# Patient Record
Sex: Female | Born: 1955 | Race: White | Hispanic: No | Marital: Single | Smoking: Never smoker
Health system: Southern US, Community
[De-identification: ages and names within clinical notes are randomized; demographics above are authoritative.]

## PROBLEM LIST (undated history)

## (undated) DIAGNOSIS — N200 Calculus of kidney: Secondary | ICD-10-CM

## (undated) DIAGNOSIS — K5190 Ulcerative colitis, unspecified, without complications: Secondary | ICD-10-CM

## (undated) DIAGNOSIS — H269 Unspecified cataract: Secondary | ICD-10-CM

## (undated) DIAGNOSIS — E559 Vitamin D deficiency, unspecified: Secondary | ICD-10-CM

## (undated) DIAGNOSIS — T7840XA Allergy, unspecified, initial encounter: Secondary | ICD-10-CM

## (undated) DIAGNOSIS — M1990 Unspecified osteoarthritis, unspecified site: Secondary | ICD-10-CM

## (undated) HISTORY — DX: Calculus of kidney: N20.0

## (undated) HISTORY — DX: Unspecified cataract: H26.9

## (undated) HISTORY — DX: Unspecified osteoarthritis, unspecified site: M19.90

## (undated) HISTORY — DX: Ulcerative colitis, unspecified, without complications: K51.90

## (undated) HISTORY — DX: Allergy, unspecified, initial encounter: T78.40XA

## (undated) HISTORY — DX: Vitamin D deficiency, unspecified: E55.9

---

## 2000-03-10 ENCOUNTER — Other Ambulatory Visit: Admission: RE | Admit: 2000-03-10 | Discharge: 2000-03-10 | Payer: Self-pay | Admitting: Obstetrics and Gynecology

## 2000-03-11 ENCOUNTER — Other Ambulatory Visit: Admission: RE | Admit: 2000-03-11 | Discharge: 2000-03-11 | Payer: Self-pay | Admitting: Obstetrics and Gynecology

## 2000-04-19 ENCOUNTER — Encounter: Payer: Self-pay | Admitting: Family Medicine

## 2000-04-19 ENCOUNTER — Encounter: Admission: RE | Admit: 2000-04-19 | Discharge: 2000-04-19 | Payer: Self-pay | Admitting: Family Medicine

## 2000-06-28 ENCOUNTER — Encounter: Admission: RE | Admit: 2000-06-28 | Discharge: 2000-06-28 | Payer: Self-pay | Admitting: Family Medicine

## 2000-06-28 ENCOUNTER — Encounter: Payer: Self-pay | Admitting: Family Medicine

## 2001-03-01 ENCOUNTER — Other Ambulatory Visit: Admission: RE | Admit: 2001-03-01 | Discharge: 2001-03-01 | Payer: Self-pay | Admitting: Obstetrics and Gynecology

## 2001-06-13 ENCOUNTER — Other Ambulatory Visit: Admission: RE | Admit: 2001-06-13 | Discharge: 2001-06-13 | Payer: Self-pay | Admitting: Obstetrics and Gynecology

## 2001-08-02 ENCOUNTER — Encounter: Payer: Self-pay | Admitting: Family Medicine

## 2001-08-02 ENCOUNTER — Encounter: Admission: RE | Admit: 2001-08-02 | Discharge: 2001-08-02 | Payer: Self-pay | Admitting: Family Medicine

## 2002-07-02 ENCOUNTER — Encounter: Admission: RE | Admit: 2002-07-02 | Discharge: 2002-07-02 | Payer: Self-pay | Admitting: Family Medicine

## 2002-07-02 ENCOUNTER — Encounter: Payer: Self-pay | Admitting: Family Medicine

## 2002-07-25 ENCOUNTER — Other Ambulatory Visit: Admission: RE | Admit: 2002-07-25 | Discharge: 2002-07-25 | Payer: Self-pay | Admitting: Obstetrics and Gynecology

## 2004-12-17 ENCOUNTER — Encounter: Admission: RE | Admit: 2004-12-17 | Discharge: 2004-12-17 | Payer: Self-pay | Admitting: Obstetrics and Gynecology

## 2006-08-08 ENCOUNTER — Other Ambulatory Visit: Admission: RE | Admit: 2006-08-08 | Discharge: 2006-08-08 | Payer: Self-pay | Admitting: Obstetrics & Gynecology

## 2007-02-20 ENCOUNTER — Ambulatory Visit: Payer: Self-pay | Admitting: Family Medicine

## 2007-08-22 DIAGNOSIS — R519 Headache, unspecified: Secondary | ICD-10-CM | POA: Insufficient documentation

## 2007-08-22 DIAGNOSIS — R51 Headache: Secondary | ICD-10-CM | POA: Insufficient documentation

## 2011-07-19 ENCOUNTER — Other Ambulatory Visit: Payer: Self-pay | Admitting: Obstetrics & Gynecology

## 2011-07-19 DIAGNOSIS — Z1231 Encounter for screening mammogram for malignant neoplasm of breast: Secondary | ICD-10-CM

## 2011-07-26 ENCOUNTER — Ambulatory Visit: Payer: Self-pay

## 2012-01-17 ENCOUNTER — Ambulatory Visit (INDEPENDENT_AMBULATORY_CARE_PROVIDER_SITE_OTHER): Payer: 59 | Admitting: Family Medicine

## 2012-01-17 DIAGNOSIS — J0110 Acute frontal sinusitis, unspecified: Secondary | ICD-10-CM

## 2012-01-17 DIAGNOSIS — J00 Acute nasopharyngitis [common cold]: Secondary | ICD-10-CM

## 2012-01-17 DIAGNOSIS — J310 Chronic rhinitis: Secondary | ICD-10-CM

## 2012-01-17 DIAGNOSIS — J019 Acute sinusitis, unspecified: Secondary | ICD-10-CM

## 2012-01-17 MED ORDER — AZITHROMYCIN 250 MG PO TABS: ORAL_TABLET | ORAL | Status: AC

## 2012-01-17 MED ORDER — FLUTICASONE PROPIONATE 50 MCG/ACT NA SUSP: 2.0000 | Freq: Every day | NASAL | Status: DC

## 2012-01-17 NOTE — Progress Notes (Signed)
  [facility]:  Office Visit  Chief Complaint:  Chief Complaint  Patient presents with  . Sinusitis    * 4 days    HPI: Mia Jordan is a 56 y.o. female who complains of  Sinus pressure, congestion, clear cough, rhinorrhea, mild fever, chills, msk aches starting today but other sxs started 3 days ago. No ear pain. Pt had flu vaccine. Tried OTC meds ie Mucinex DM without relief.   History reviewed. No pertinent past medical history. History reviewed. No pertinent past surgical history. History   Social History  . Marital Status: Divorced    Spouse Name: N/A    Number of Children: N/A  . Years of Education: N/A   Social History Main Topics  . Smoking status: Never Smoker   . Smokeless tobacco: Never Used  . Alcohol Use: Yes  . Drug Use: No  . Sexually Active: Not Currently   Other Topics Concern  . None   Social History Narrative  . None   No family history on file. No Known Allergies Prior to Admission medications   Not on File     ROS: The patient denies night sweats, unintentional weight loss, chest pain, palpitations, wheezing, dyspnea on exertion, nausea, vomiting, abdominal pain, dysuria, hematuria, melena, numbness, weakness, or tingling.   All other systems have been reviewed and were otherwise negative with the exception of those mentioned in the HPI and as above.    PHYSICAL EXAM: Filed Vitals:   01/17/12 0908  BP: 116/72  Pulse: 80  Temp: 98.1 F (36.7 C)  Resp: 18   Filed Vitals:   01/17/12 0908  Height: 5' 7"  (1.702 m)  Weight: 151 lb (68.493 kg)   Body mass index is 23.65 kg/(m^2).  General: Alert, no acute distress HEENT:  Normocephalic, atraumatic, oropharynx patent.No exudates, + eythema. + sinus pressure and tenderness, max bialterally.  Cardiovascular:  Regular rate and rhythm, no rubs murmurs or gallops.  No Carotid bruits, radial pulse intact. No pedal edema.  Respiratory: Clear to auscultation bilaterally.   No wheezes, rales, or rhonchi.  No cyanosis, no use of accessory musculature GI: No organomegaly, abdomen is soft and non-tender, positive bowel sounds.  No masses. Skin: No rashes. Neurologic: Facial musculature symmetric. Psychiatric: Patient is appropriate throughout our interaction. Lymphatic: No axillary or cervical lymphadenopathy Musculoskeletal: Gait intact. Genitourinary:     ASSESSMENT/PLAN: Encounter Diagnoses  Name Primary?  . Sinusitis acute   . Rhinitis      1. Rx Z-pack. Patient declines flu swab. Sx treatment.  2. Rx Flonase.   Ayauna Mcnay, [place], DO 01/17/2012 9:42 AM

## 2012-01-17 NOTE — Patient Instructions (Signed)
Sinusitis Sinuses are air pockets within the bones of your face. The growth of bacteria within a sinus leads to infection. The infection prevents the sinuses from draining. This infection is called sinusitis. SYMPTOMS   There will be different areas of pain depending on which sinuses have become infected.  The maxillary sinuses often produce pain beneath the eyes.     Frontal sinusitis may cause pain in the middle of the forehead and above the eyes.  Other problems (symptoms) include:  Toothaches.     Colored, pus-like (purulent) drainage from the nose.     Swelling, warmth, and tenderness over the sinus areas may be signs of infection.  TREATMENT   Sinusitis is most often determined by an exam.X-rays may be taken. If x-rays have been taken, make sure you obtain your results or find out how you are to obtain them. Your caregiver may give you medications (antibiotics). These are medications that will help kill the bacteria causing the infection. You may also be given a medication (decongestant) that helps to reduce sinus swelling.   HOME CARE INSTRUCTIONS    Only take over-the-counter or prescription medicines for pain, discomfort, or fever as directed by your caregiver.     Drink extra fluids. Fluids help thin the mucus so your sinuses can drain more easily.     Applying either moist heat or ice packs to the sinus areas may help relieve discomfort.     Use saline nasal sprays to help moisten your sinuses. The sprays can be found at your local drugstore.  SEEK IMMEDIATE MEDICAL CARE IF:  You have a fever.     You have increasing pain, severe headaches, or toothache.     You have nausea, vomiting, or drowsiness.     You develop unusual swelling around the face or trouble seeing.  MAKE SURE YOU:    Understand these instructions.     Will watch your condition.     Will get help right away if you are not doing well or get worse.  Document Released: 11/29/2005 Document Revised:  08/11/2011 Document Reviewed: 06/28/2007 [facility] Patient Information 2012 [place].Sinusitis Sinuses are air pockets within the bones of your face. The growth of bacteria within a sinus leads to infection. The infection prevents the sinuses from draining. This infection is called sinusitis. SYMPTOMS   There will be different areas of pain depending on which sinuses have become infected.  The maxillary sinuses often produce pain beneath the eyes.     Frontal sinusitis may cause pain in the middle of the forehead and above the eyes.  Other problems (symptoms) include:  Toothaches.     Colored, pus-like (purulent) drainage from the nose.     Swelling, warmth, and tenderness over the sinus areas may be signs of infection.  TREATMENT   Sinusitis is most often determined by an exam.X-rays may be taken. If x-rays have been taken, make sure you obtain your results or find out how you are to obtain them. Your caregiver may give you medications (antibiotics). These are medications that will help kill the bacteria causing the infection. You may also be given a medication (decongestant) that helps to reduce sinus swelling.   HOME CARE INSTRUCTIONS    Only take over-the-counter or prescription medicines for pain, discomfort, or fever as directed by your caregiver.     Drink extra fluids. Fluids help thin the mucus so your sinuses can drain more easily.     Applying either moist heat or ice  packs to the sinus areas may help relieve discomfort.     Use saline nasal sprays to help moisten your sinuses. The sprays can be found at your local drugstore.  SEEK IMMEDIATE MEDICAL CARE IF:  You have a fever.     You have increasing pain, severe headaches, or toothache.     You have nausea, vomiting, or drowsiness.     You develop unusual swelling around the face or trouble seeing.  MAKE SURE YOU:    Understand these instructions.     Will watch your condition.     Will get help right away  if you are not doing well or get worse.  Document Released: 11/29/2005 Document Revised: 08/11/2011 Document Reviewed: 06/28/2007 [facility] Patient Information 2012 [place].

## 2012-01-26 LAB — HM COLONOSCOPY

## 2012-07-13 ENCOUNTER — Ambulatory Visit
Admission: RE | Admit: 2012-07-13 | Discharge: 2012-07-13 | Disposition: A | Discharge status: Home / Self Care | Payer: Self-pay | Source: Ambulatory Visit | Attending: Obstetrics & Gynecology | Admitting: Obstetrics & Gynecology

## 2012-07-13 DIAGNOSIS — Z1231 Encounter for screening mammogram for malignant neoplasm of breast: Secondary | ICD-10-CM

## 2012-07-18 ENCOUNTER — Other Ambulatory Visit: Payer: Self-pay | Admitting: Obstetrics & Gynecology

## 2012-07-18 DIAGNOSIS — R928 Other abnormal and inconclusive findings on diagnostic imaging of breast: Secondary | ICD-10-CM

## 2012-07-27 ENCOUNTER — Ambulatory Visit
Admission: RE | Admit: 2012-07-27 | Discharge: 2012-07-27 | Disposition: A | Discharge status: Home / Self Care | Payer: 59 | Source: Ambulatory Visit | Attending: Obstetrics & Gynecology | Admitting: Obstetrics & Gynecology

## 2012-07-27 ENCOUNTER — Other Ambulatory Visit: Payer: Self-pay | Admitting: Obstetrics & Gynecology

## 2012-07-27 DIAGNOSIS — R928 Other abnormal and inconclusive findings on diagnostic imaging of breast: Secondary | ICD-10-CM

## 2013-04-07 ENCOUNTER — Ambulatory Visit: Payer: 59

## 2013-04-07 ENCOUNTER — Ambulatory Visit (INDEPENDENT_AMBULATORY_CARE_PROVIDER_SITE_OTHER): Payer: 59 | Admitting: Family Medicine

## 2013-04-07 VITALS — BP 128/84 | HR 60 | Temp 97.3°F | Resp 18 | Ht 67.0 in | Wt 159.0 lb

## 2013-04-07 DIAGNOSIS — R109 Unspecified abdominal pain: Secondary | ICD-10-CM

## 2013-04-07 DIAGNOSIS — N200 Calculus of kidney: Secondary | ICD-10-CM

## 2013-04-07 LAB — COMPREHENSIVE METABOLIC PANEL
ALT: 27 U/L (ref 0–35)
AST: 26 U/L (ref 0–37)
Albumin: 4.4 g/dL (ref 3.5–5.2)
Alkaline Phosphatase: 50 U/L (ref 39–117)
BUN: 21 mg/dL (ref 6–23)
CO2: 27 mEq/L (ref 19–32)
Calcium: 9.3 mg/dL (ref 8.4–10.5)
Chloride: 103 mEq/L (ref 96–112)
Creat: 0.83 mg/dL (ref 0.50–1.10)
Glucose, Bld: 97 mg/dL (ref 70–99)
Potassium: 4 mEq/L (ref 3.5–5.3)
Sodium: 137 mEq/L (ref 135–145)
Total Bilirubin: 1.1 mg/dL (ref 0.3–1.2)
Total Protein: 6.8 g/dL (ref 6.0–8.3)

## 2013-04-07 LAB — POCT UA - MICROSCOPIC ONLY
Casts, Ur, LPF, POC: NEGATIVE
Crystals, Ur, HPF, POC: NEGATIVE
Yeast, UA: NEGATIVE

## 2013-04-07 LAB — POCT CBC
Granulocyte percent: 77.4 %G (ref 37–80)
HCT, POC: 38.6 % (ref 37.7–47.9)
Hemoglobin: 12.2 g/dL (ref 12.2–16.2)
Lymph, poc: 1.1 (ref 0.6–3.4)
MCH, POC: 29.4 pg (ref 27–31.2)
MCHC: 31.6 g/dL — AB (ref 31.8–35.4)
MCV: 92.9 fL (ref 80–97)
MID (cbc): 0.5 (ref 0–0.9)
MPV: 10.8 fL (ref 0–99.8)
POC Granulocyte: 5.5 (ref 2–6.9)
POC LYMPH PERCENT: 16 %L (ref 10–50)
POC MID %: 6.6 %M (ref 0–12)
Platelet Count, POC: 229 10*3/uL (ref 142–424)
RBC: 4.15 M/uL (ref 4.04–5.48)
RDW, POC: 12.6 %
WBC: 7.1 10*3/uL (ref 4.6–10.2)

## 2013-04-07 LAB — POCT URINALYSIS DIPSTICK
Bilirubin, UA: NEGATIVE
Glucose, UA: NEGATIVE
Ketones, UA: 15
Nitrite, UA: NEGATIVE
Protein, UA: 30
Spec Grav, UA: 1.025
Urobilinogen, UA: 0.2
pH, UA: 6.5

## 2013-04-07 MED ORDER — CEFTRIAXONE SODIUM 1 G IJ SOLR
1.0000 g | Freq: Once | INTRAMUSCULAR | Status: AC
  Administered 2013-04-07: 1 g via INTRAMUSCULAR

## 2013-04-07 MED ORDER — ONDANSETRON 4 MG PO TBDP
8.0000 mg | ORAL_TABLET | Freq: Once | ORAL | Status: AC
  Administered 2013-04-07: 8 mg via ORAL

## 2013-04-07 MED ORDER — ONDANSETRON 4 MG PO TBDP: 4.0000 mg | ORAL_TABLET | Freq: Once | ORAL | Status: DC

## 2013-04-07 MED ORDER — TAMSULOSIN HCL 0.4 MG PO CAPS: 0.4000 mg | ORAL_CAPSULE | Freq: Every day | ORAL | Status: DC

## 2013-04-07 MED ORDER — KETOROLAC TROMETHAMINE 60 MG/2ML IM SOLN
60.0000 mg | Freq: Once | INTRAMUSCULAR | Status: AC
  Administered 2013-04-07: 60 mg via INTRAMUSCULAR

## 2013-04-07 MED ORDER — KETOROLAC TROMETHAMINE 10 MG PO TABS: 10.0000 mg | ORAL_TABLET | Freq: Four times a day (QID) | ORAL | Status: DC | PRN

## 2013-04-07 MED ORDER — ONDANSETRON 4 MG PO TBDP: 4.0000 mg | ORAL_TABLET | Freq: Three times a day (TID) | ORAL | Status: DC | PRN

## 2013-04-07 NOTE — Patient Instructions (Addendum)
Kidney Stones Kidney stones (ureteral lithiasis) are deposits that form inside your kidneys. The intense pain is caused by the stone moving through the urinary tract. When the stone moves, the ureter goes into spasm around the stone. The stone is usually passed in the urine.  CAUSES   A disorder that makes certain neck glands produce too much parathyroid hormone (primary hyperparathyroidism).  A buildup of uric acid crystals.  Narrowing (stricture) of the ureter.  A kidney obstruction present at birth (congenital obstruction).  Previous surgery on the kidney or ureters.  Numerous kidney infections. SYMPTOMS   Feeling sick to your stomach (nauseous).  Throwing up (vomiting).  Blood in the urine (hematuria).  Pain that usually spreads (radiates) to the groin.  Frequency or urgency of urination. DIAGNOSIS   Taking a history and physical exam.  Blood or urine tests.  Computerized X-ray scan (CT scan).  Occasionally, an examination of the inside of the urinary bladder (cystoscopy) is performed. TREATMENT   Observation.  Increasing your fluid intake.  Surgery may be needed if you have severe pain or persistent obstruction. The size, location, and chemical composition are all important variables that will determine the proper choice of action for you. Talk to your caregiver to better understand your situation so that you will minimize the risk of injury to yourself and your kidney.  HOME CARE INSTRUCTIONS   Drink enough water and fluids to keep your urine clear or pale yellow.  Strain all urine through the provided strainer. Keep all particulate matter and stones for your caregiver to see. The stone causing the pain may be as small as a grain of salt. It is very important to use the strainer each and every time you pass your urine. The collection of your stone will allow your caregiver to analyze it and verify that a stone has actually passed.  Only take over-the-counter or  prescription medicines for pain, discomfort, or fever as directed by your caregiver.  Make a follow-up appointment with your caregiver as directed.  Get follow-up X-rays if required. The absence of pain does not always mean that the stone has passed. It may have only stopped moving. If the urine remains completely obstructed, it can cause loss of kidney function or even complete destruction of the kidney. It is your responsibility to make sure X-rays and follow-ups are completed. Ultrasounds of the kidney can show blockages and the status of the kidney. Ultrasounds are not associated with any radiation and can be performed easily in a matter of minutes. SEEK IMMEDIATE MEDICAL CARE IF:   Pain cannot be controlled with the prescribed medicine.  You have a fever.  The severity or intensity of pain increases over 18 hours and is not relieved by pain medicine.  You develop a new onset of abdominal pain.  You feel faint or pass out. MAKE SURE YOU:   Understand these instructions.  Will watch your condition.  Will get help right away if you are not doing well or get worse. Document Released: 11/29/2005 Document Revised: 02/21/2012 Document Reviewed: 03/27/2010 [facility] Patient Information 2013 [place].

## 2013-04-07 NOTE — Progress Notes (Signed)
Subjective:    Patient ID: Mia Jordan, female    DOB: 10-Oct-1956, 57 y.o.   MRN: [id number] Chief Complaint  Patient presents with  . Abdominal Pain    the pain was in her back lastnight and it has moved to the right side. She feels it may be a kidney stone   HPI [name] is a delightful 57 yo woman who is having the worst abd pain of her life. Has never had anything like this prior.  Yest she developed a lot of back pain which she attributed to her wearing heals all day but it worsened and began radiating around her hips to her abd. The pain became so bad, she had to have someone driver her home from work and she was squirming in the car, couldn't keep still due to the pain.  She put a heating pad on her abd and almost all sxs went away, she slept fine, and this morning it started happening again with severe abd pain radiating from RUQ to pelvis - colicky - worse than labor.  Felt najuseas yest.  Has been pushing water but not able to eat anything.  Took 2 aleve around 1:30 this a.m.    Past Medical History  Diagnosis Date  . Ulcerative colitis    Current Outpatient Prescriptions on File Prior to Visit  Medication Sig Dispense Refill  . fluticasone (FLONASE) 50 MCG/ACT nasal spray Place 2 sprays into the nose daily.  16 g  2   No current facility-administered medications on file prior to visit.   No Known Allergies   Review of Systems  Constitutional: Positive for diaphoresis, activity change, appetite change and fatigue. Negative for fever, chills and unexpected weight change.  Respiratory: Negative for shortness of breath.   Cardiovascular: Negative for chest pain and leg swelling.  Gastrointestinal: Positive for nausea, abdominal pain, diarrhea and constipation. Negative for vomiting, blood in stool, abdominal distention, anal bleeding and rectal pain.  Genitourinary: Negative for dysuria, decreased urine volume and difficulty urinating.  Musculoskeletal: Negative for  gait problem.  Skin: Negative for rash.  Hematological: Negative for adenopathy.  Psychiatric/Behavioral: Negative for sleep disturbance.       BP 128/84  Pulse 60  Temp(Src) 97.3 F (36.3 C) (Oral)  Resp 18  Ht 5' 7"  (1.702 m)  Wt 159 lb (72.122 kg)  BMI 24.9 kg/m2  SpO2 100%  LMP 03/19/2013 Objective:   Physical Exam  Constitutional: She is oriented to person, place, and time. She appears well-developed and well-nourished. No distress.  HENT:  Head: Normocephalic and atraumatic.  Neck: Normal range of motion. Neck supple. No thyromegaly present.  Cardiovascular: Normal rate, regular rhythm, normal heart sounds and intact distal pulses.   Pulmonary/Chest: Effort normal and breath sounds normal. No respiratory distress.  Abdominal: Soft. Bowel sounds are normal. There is no hepatosplenomegaly. There is tenderness in the right upper quadrant and right lower quadrant. There is CVA tenderness. There is no rigidity, no rebound, no guarding, no tenderness at McBurney's point and negative Murphy's sign. No hernia.  Mild CVA tenderness on right.  Musculoskeletal: She exhibits no edema.  Lymphadenopathy:    She has no cervical adenopathy.  Neurological: She is alert and oriented to person, place, and time.  Skin: Skin is warm and dry. She is not diaphoretic. No erythema.  Psychiatric: She has a normal mood and affect. Her behavior is normal.      [facility] reading (PRIMARY) by  Dr. Brigitte Pulse. 2 view abdomen: moderate  stool burden, non-specific bowel gas pattern. No stones visible. Results for orders placed in visit on 04/07/13  POCT UA - MICROSCOPIC ONLY      Result Value Range   WBC, Ur, HPF, POC 5-10     RBC, urine, microscopic TNTC     Bacteria, U Microscopic 2+     Mucus, UA trace     Epithelial cells, urine per micros 5-8     Crystals, Ur, HPF, POC neg     Casts, Ur, LPF, POC neg     Yeast, UA neg    POCT URINALYSIS DIPSTICK      Result Value Range   Color, UA yellow     Clarity,  UA cloudy     Glucose, UA neg     Bilirubin, UA neg     Ketones, UA 15     Spec Grav, UA 1.025     Blood, UA large     pH, UA 6.5     Protein, UA 30     Urobilinogen, UA 0.2     Nitrite, UA neg     Leukocytes, UA small (1+)    POCT CBC      Result Value Range   WBC 7.1  4.6 - 10.2 K/uL   Lymph, poc 1.1  0.6 - 3.4   POC LYMPH PERCENT 16.0  10 - 50 %L   MID (cbc) 0.5  0 - 0.9   POC MID % 6.6  0 - 12 %M   POC Granulocyte 5.5  2 - 6.9   Granulocyte percent 77.4  37 - 80 %G   RBC 4.15  4.04 - 5.48 M/uL   Hemoglobin 12.2  12.2 - 16.2 g/dL   HCT, POC 38.6  37.7 - 47.9 %   MCV 92.9  80 - 97 fL   MCH, POC 29.4  27 - 31.2 pg   MCHC 31.6 (*) 31.8 - 35.4 g/dL   RDW, POC 12.6     Platelet Count, POC 229  142 - 424 K/uL   MPV 10.8  0 - 99.8 fL    Assessment & Plan:  Abdominal  pain, other specified site - Plan: POCT UA - Microscopic Only, POCT urinalysis dipstick, POCT CBC, Comprehensive metabolic panel, ketorolac (TORADOL) injection 60 mg, ondansetron (ZOFRAN-ODT) disintegrating tablet 8 mg, DG Abd 2 Views, Urine culture, CANCELED: DG Abd 1 View, DISCONTINUED: ondansetron (ZOFRAN-ODT) disintegrating tablet 4 mg  Nephrolithiasis - Plan: Stone analysis, cefTRIAXone (ROCEPHIN) injection 1 g - I suspect pt's abd pain is due to a kidney stone.  Pt did have some bacteria in her urine - I suspect it was simply not a clean catch but urine sent for culture and treated empirically w/ 1g of Rocephin x 1 today in the meantime. She got sig relief after toradol 42m IM and zofran 456msl.  She will strain her urine, continue pain control with oral toradol, start flomax to increase ureteral peristalsis, and push fluids. When she passes her stone, she will bring it in for analysis - orders entered. If pain becomes worse, rec abd/pelvic CT scan.  If she needs something additional for pain, I will be happy to call in some vicodin for her but she got so much relief from the toradol in office will hold off for  now.  Meds ordered this encounter  Medications  . mesalamine (LIALDA) 1.2 G EC tablet    Sig: Take 1,200 mg by mouth daily with breakfast.  . DISCONTD: ondansetron (ZOFRAN-ODT)  disintegrating tablet 4 mg    Sig:   . ketorolac (TORADOL) injection 60 mg    Sig:   . ondansetron (ZOFRAN-ODT) disintegrating tablet 8 mg    Sig:   . ondansetron (ZOFRAN ODT) 4 MG disintegrating tablet    Sig: Take 1 tablet (4 mg total) by mouth every 8 (eight) hours as needed for nausea.    Dispense:  20 tablet    Refill:  0  . ketorolac (TORADOL) 10 MG tablet    Sig: Take 1 tablet (10 mg total) by mouth every 6 (six) hours as needed for pain.    Dispense:  20 tablet    Refill:  0  . cefTRIAXone (ROCEPHIN) injection 1 g    Sig:   . tamsulosin (FLOMAX) 0.4 MG CAPS    Sig: Take 1 capsule (0.4 mg total) by mouth daily.    Dispense:  10 capsule    Refill:  0

## 2013-04-08 ENCOUNTER — Encounter: Payer: Self-pay | Admitting: Family Medicine

## 2013-04-08 LAB — URINE CULTURE: Colony Count: 85000

## 2013-04-09 ENCOUNTER — Encounter: Payer: Self-pay | Admitting: Family Medicine

## 2013-04-09 ENCOUNTER — Telehealth: Payer: Self-pay | Admitting: *Deleted

## 2013-04-09 ENCOUNTER — Other Ambulatory Visit: Payer: Self-pay | Admitting: Family Medicine

## 2013-04-09 MED ORDER — PROMETHAZINE HCL 25 MG PO TABS: 25.0000 mg | ORAL_TABLET | Freq: Three times a day (TID) | ORAL | Status: DC | PRN

## 2013-04-09 MED ORDER — HYDROCODONE-ACETAMINOPHEN 5-325 MG PO TABS: 2.0000 | ORAL_TABLET | ORAL | Status: DC | PRN

## 2013-04-09 NOTE — Telephone Encounter (Signed)
error 

## 2013-04-09 NOTE — Telephone Encounter (Signed)
Patient reports a really bad headache and nausea today. The headache is worst part today along with the nausea. She would like something stronger and is worried that this was not normal. She agrees with getting different medication to help with her symptoms and she will continue to screen her urine. Rx has been sent into the pharm

## 2013-04-10 ENCOUNTER — Encounter: Payer: Self-pay | Admitting: Family Medicine

## 2013-04-11 ENCOUNTER — Other Ambulatory Visit: Payer: Self-pay | Admitting: Family Medicine

## 2013-04-11 DIAGNOSIS — N200 Calculus of kidney: Secondary | ICD-10-CM

## 2013-04-12 ENCOUNTER — Telehealth: Payer: Self-pay | Admitting: Radiology

## 2013-04-12 NOTE — Telephone Encounter (Signed)
[facility] calling about CT scan, can we order without contrast for stone protocol? Or are we looking for anything else?

## 2013-04-13 ENCOUNTER — Other Ambulatory Visit: Payer: Self-pay | Admitting: Radiology

## 2013-04-13 ENCOUNTER — Telehealth: Payer: Self-pay

## 2013-04-13 DIAGNOSIS — R109 Unspecified abdominal pain: Secondary | ICD-10-CM

## 2013-04-13 NOTE — Telephone Encounter (Signed)
Patient would like dr Brigitte Pulse to call her before she proceeds with her ct scan that is scheduled with [facility]

## 2013-04-14 NOTE — Telephone Encounter (Signed)
Called pt and left message. I am not working today, I do not know when her CT scan was scheduled. LVM - If she could call back and let us know what her question or concern is, we could probably address more quickly.  Am working Sun morning if pt needs to discuss only w/ me.

## 2013-04-16 NOTE — Telephone Encounter (Signed)
Called her, she states she has decided to go. She states her pain has improved significantly but she is going out of town. To you FYI she does have appt with [facility] on [address]. Patient will follow up with you on Friday for immunization review before she leaves the country.

## 2013-04-16 NOTE — Telephone Encounter (Signed)
Ct scheduled for Tues.

## 2013-04-17 ENCOUNTER — Ambulatory Visit
Admission: RE | Admit: 2013-04-17 | Discharge: 2013-04-17 | Disposition: A | Discharge status: Home / Self Care | Payer: 59 | Source: Ambulatory Visit | Attending: Family Medicine | Admitting: Family Medicine

## 2013-04-17 DIAGNOSIS — R109 Unspecified abdominal pain: Secondary | ICD-10-CM

## 2013-04-17 NOTE — Telephone Encounter (Signed)
Was advised okay for CT w/o contrast this was done/ stone protocol

## 2013-04-20 ENCOUNTER — Encounter: Payer: Self-pay | Admitting: Family Medicine

## 2013-04-20 ENCOUNTER — Ambulatory Visit: Payer: 59 | Admitting: Family Medicine

## 2013-04-20 ENCOUNTER — Ambulatory Visit (INDEPENDENT_AMBULATORY_CARE_PROVIDER_SITE_OTHER): Payer: 59 | Admitting: Family Medicine

## 2013-04-20 VITALS — BP 108/70 | HR 71 | Temp 98.0°F | Resp 16 | Ht 67.0 in | Wt 158.0 lb

## 2013-04-20 DIAGNOSIS — Z7184 Encounter for health counseling related to travel: Secondary | ICD-10-CM

## 2013-04-20 DIAGNOSIS — K5190 Ulcerative colitis, unspecified, without complications: Secondary | ICD-10-CM | POA: Insufficient documentation

## 2013-04-20 DIAGNOSIS — Z23 Encounter for immunization: Secondary | ICD-10-CM

## 2013-04-20 DIAGNOSIS — N200 Calculus of kidney: Secondary | ICD-10-CM | POA: Insufficient documentation

## 2013-04-20 MED ORDER — HYOSCYAMINE SULFATE ER 0.375 MG PO TB12: 0.3750 mg | ORAL_TABLET | Freq: Two times a day (BID) | ORAL | Status: DC | PRN

## 2013-04-20 MED ORDER — LOPERAMIDE HCL 2 MG PO CAPS: 2.0000 mg | ORAL_CAPSULE | Freq: Four times a day (QID) | ORAL | Status: DC | PRN

## 2013-04-20 MED ORDER — TYPHOID VACCINE PO CPDR: 1.0000 | DELAYED_RELEASE_CAPSULE | ORAL | Status: DC

## 2013-04-20 MED ORDER — CIPROFLOXACIN HCL 500 MG PO TABS: 500.0000 mg | ORAL_TABLET | Freq: Two times a day (BID) | ORAL | Status: DC

## 2013-04-20 MED ORDER — AZITHROMYCIN 250 MG PO TABS: ORAL_TABLET | ORAL | Status: DC

## 2013-04-20 MED ORDER — PREDNISONE 20 MG PO TABS: ORAL_TABLET | ORAL | Status: DC

## 2013-04-20 NOTE — Progress Notes (Signed)
  Subjective:    Patient ID: Mia Jordan, female    DOB: 06/15/1956, 57 y.o.   MRN: [id number]  HPI  Leaving June 5.  Is going over with a work at [employer].  Going to be there for 2 1/2 wks going to [employer]  She gave her a zpack. Needs a tetanus.  Moved here at [place] and mainly been seen at [facility].  Has traveled to [place] and [place] -   Has ulcerative colitis - is on Lialda from Dr. Collene Mares but has flaired up recently.  Is having more trouble remembering flair - had blood in her stool recently -   Review of Systems    BP 108/70  Pulse 71  Temp(Src) 98 F (36.7 C) (Oral)  Resp 16  Ht 5' 7"  (1.702 m)  Wt 158 lb (71.668 kg)  BMI 24.74 kg/m2  SpO2 98%  LMP 03/19/2013 Objective:   Physical Exam        Assessment & Plan:   thypoid vaccine

## 2013-04-20 NOTE — Patient Instructions (Addendum)
Primary vaccination with oral Vivotif Ty21a vaccine consists of 4 capsules, 1 taken every other day. The capsules should be kept refrigerated (not frozen), and all 4 doses must be taken to achieve maximum efficacy. Each capsule should be taken with cool liquid no warmer than 98.12F (37C), approximately 1 hour before a meal. This regimen should be completed 1 week before potential exposure.  Return to clinic in 2 months for your second Hep B injection and in 6 months for your third Hep B injection and your second Hep A injection.

## 2013-05-14 ENCOUNTER — Encounter: Payer: Self-pay | Admitting: Family Medicine

## 2013-05-14 ENCOUNTER — Telehealth: Payer: Self-pay

## 2013-05-14 NOTE — Telephone Encounter (Signed)
Pt states she needs a letter in order to take some medications over seas. Wants to take her hydrocodone with her in case her kidney stone passes while she's there. Also her anti-nausea medication. Feels like she needs a note just in case.  Pt needs note by Wednesday. If possible a fax would be ok or sending it through mychart per patient.  Fax: [phone]  Please call pt

## 2013-05-15 NOTE — Telephone Encounter (Signed)
Done and faxed, Mia Jordan

## 2013-05-15 NOTE — Telephone Encounter (Signed)
Yes, please write a general letter stating she is prescribed hydrocodone for kidney stone pain and zofran for nausea from the kidney stones. Use as directed on the bottles as needed.

## 2013-07-04 DIAGNOSIS — K5190 Ulcerative colitis, unspecified, without complications: Secondary | ICD-10-CM

## 2013-08-27 ENCOUNTER — Telehealth: Payer: Self-pay

## 2013-08-27 NOTE — Telephone Encounter (Signed)
Pt. Is late for her shot series and wants to know if its ok to still come in on Friday for the second round. [phone]

## 2013-08-28 NOTE — Telephone Encounter (Signed)
Okay for her to come in now for this, called her to advise

## 2013-08-31 ENCOUNTER — Ambulatory Visit (INDEPENDENT_AMBULATORY_CARE_PROVIDER_SITE_OTHER): Payer: 59 | Admitting: Family Medicine

## 2013-08-31 ENCOUNTER — Encounter: Payer: Self-pay | Admitting: Family Medicine

## 2013-08-31 VITALS — BP 115/70 | HR 69 | Temp 98.5°F | Resp 16 | Ht 67.0 in | Wt 158.0 lb

## 2013-08-31 DIAGNOSIS — Z23 Encounter for immunization: Secondary | ICD-10-CM

## 2013-08-31 DIAGNOSIS — R21 Rash and other nonspecific skin eruption: Secondary | ICD-10-CM

## 2013-08-31 LAB — POCT SKIN KOH: Skin KOH, POC: NEGATIVE

## 2013-08-31 MED ORDER — BETAMETHASONE VALERATE 0.1 % EX OINT: TOPICAL_OINTMENT | Freq: Two times a day (BID) | CUTANEOUS | Status: DC

## 2013-08-31 NOTE — Patient Instructions (Addendum)
Return to clinic anytime after Oct 31, 2013 to receive your 3rd Hepatitis B vaccine and 2nd Hepatitis A - then you will have completed the series and not need any further vaccinations. If you do not have any medical problems at that time, you can come next door to our [facility] [address] and let them know that you are there for immunizations only - that will be a quick in and out visit as you would not have to wait to see a doctor.

## 2013-08-31 NOTE — Progress Notes (Signed)
Subjective:    Patient ID: Mia Jordan, female    DOB: 10/08/1956, 57 y.o.   MRN: [id number]  Chief Complaint  Patient presents with  . Vaccinations  . Skin Rash   HPI  Mia Jordan is doing well. Her trip to [facility] was really successful.  While she was there, she felt something bite her. The area became very dry and itchy. Over the past 2 months, it has persisted and enlarged forming several dry rough wrinkly light patches on her left forearm. Has not tried anything on it other occasional lotions.  Past Medical History  Diagnosis Date  . Ulcerative colitis    Current Outpatient Prescriptions on File Prior to Visit  Medication Sig Dispense Refill  . mesalamine (LIALDA) 1.2 G EC tablet Take 1,200 mg by mouth daily with breakfast.      . azithromycin (ZITHROMAX) 250 MG tablet Take 2 tabs PO x 1 dose, then 1 tab PO QD x 4 days  6 tablet  0  . ciprofloxacin (CIPRO) 500 MG tablet Take 1 tablet (500 mg total) by mouth 2 (two) times daily.  20 tablet  0  . fluticasone (FLONASE) 50 MCG/ACT nasal spray Place 2 sprays into the nose daily.  16 g  2  . HYDROcodone-acetaminophen (NORCO/VICODIN) 5-325 MG per tablet Take 2 tablets by mouth every 4 (four) hours as needed for pain.  40 tablet  0  . hyoscyamine (LEVBID) 0.375 MG 12 hr tablet Take 1 tablet (0.375 mg total) by mouth every 12 (twelve) hours as needed for cramping.  60 tablet  0  . loperamide (IMODIUM) 2 MG capsule Take 1 capsule (2 mg total) by mouth 4 (four) times daily as needed for diarrhea or loose stools.  30 capsule  0  . predniSONE (DELTASONE) 20 MG tablet For severe UC flair, take 3 tabs daily x 5d, 2 tabs daily x 5d, 1 tab daily x 5d, then 1/2 tab daily x 6d and see a physician ASAP.  33 tablet  0  . typhoid (VIVOTIF) DR capsule Take 1 capsule by mouth every other day.  4 capsule  0   No current facility-administered medications on file prior to visit.   No Known Allergies   Review of Systems  Constitutional: Negative for  fever, chills and diaphoresis.  Musculoskeletal: Negative for joint swelling and arthralgias.  Skin: Positive for color change and rash. Negative for pallor and wound.  Hematological: Negative for adenopathy. Does not bruise/bleed easily.      BP 115/70  Pulse 69  Temp(Src) 98.5 F (36.9 C) (Oral)  Resp 16  Ht 5' 7"  (1.702 m)  Wt 158 lb (71.668 kg)  BMI 24.74 kg/m2  SpO2 99% Objective:   Physical Exam  Constitutional: She is oriented to person, place, and time. She appears well-developed and well-nourished. No distress.  HENT:  Head: Normocephalic and atraumatic.  Right Ear: External ear normal.  Eyes: Conjunctivae are normal. No scleral icterus.  Pulmonary/Chest: Effort normal.  Neurological: She is alert and oriented to person, place, and time.  Skin: Skin is warm and dry. Rash noted. Rash is macular. She is not diaphoretic. No erythema.  3 large hypopigmented pink rough large round patches, slightly irregular border, sec in dm, on left lateral forearm.  Psychiatric: She has a normal mood and affect. Her behavior is normal.      Results for orders placed in visit on 08/31/13  POCT SKIN KOH      Result Value Range   Skin  KOH, POC Negative     Assessment & Plan:  .Need for prophylactic vaccination and inoculation against influenza - Plan: Flu Vaccine QUAD 36+ mos IM  Need for hepatitis B vaccination - Plan: Hepatitis B vaccine adult IM  Rash and nonspecific skin eruption - Plan: POCT Skin KOH - did not light up under wood's lamp so treat w/ steroid. If no improvement in 1-2 wks, rec biopsy or derm referral  Meds ordered this encounter  Medications  . betamethasone valerate ointment (VALISONE) 0.1 %    Sig: Apply topically 2 (two) times daily.    Dispense:  45 g    Refill:  0

## 2013-09-04 ENCOUNTER — Encounter: Payer: Self-pay | Admitting: Obstetrics & Gynecology

## 2013-09-05 ENCOUNTER — Telehealth: Payer: Self-pay | Admitting: *Deleted

## 2013-09-05 NOTE — Telephone Encounter (Signed)
T/C to patient to schedule a follow up (8month mammogram appointment, per pt she thought this may have already been done I told her that the last mammo report we have and [facility] has record of was 07/27/12. I  Offered to schedule this appointment for Her and she said she would call [facility] today and schedule this follow up mammogram appointment. I will check with [facility] later To see if this has been done.

## 2013-09-06 ENCOUNTER — Other Ambulatory Visit: Payer: Self-pay | Admitting: Obstetrics & Gynecology

## 2013-09-06 DIAGNOSIS — N6489 Other specified disorders of breast: Secondary | ICD-10-CM

## 2013-09-06 DIAGNOSIS — R921 Mammographic calcification found on diagnostic imaging of breast: Secondary | ICD-10-CM

## 2013-09-06 NOTE — Telephone Encounter (Signed)
appt scheduled 10/16/13. Recall changed to 11/14.  MSM

## 2013-10-16 ENCOUNTER — Ambulatory Visit
Admission: RE | Admit: 2013-10-16 | Discharge: 2013-10-16 | Disposition: A | Discharge status: Home / Self Care | Payer: 59 | Source: Ambulatory Visit | Attending: Obstetrics & Gynecology | Admitting: Obstetrics & Gynecology

## 2013-10-16 DIAGNOSIS — R921 Mammographic calcification found on diagnostic imaging of breast: Secondary | ICD-10-CM

## 2013-10-16 DIAGNOSIS — N6489 Other specified disorders of breast: Secondary | ICD-10-CM

## 2013-11-01 ENCOUNTER — Ambulatory Visit: Payer: 59 | Admitting: Obstetrics & Gynecology

## 2013-11-01 ENCOUNTER — Encounter: Payer: Self-pay | Admitting: Obstetrics & Gynecology

## 2013-11-01 ENCOUNTER — Ambulatory Visit (INDEPENDENT_AMBULATORY_CARE_PROVIDER_SITE_OTHER): Payer: 59 | Admitting: Obstetrics & Gynecology

## 2013-11-01 VITALS — BP 112/68 | HR 60 | Resp 16 | Ht 67.5 in | Wt 161.8 lb

## 2013-11-01 DIAGNOSIS — Z01419 Encounter for gynecological examination (general) (routine) without abnormal findings: Secondary | ICD-10-CM

## 2013-11-01 DIAGNOSIS — N912 Amenorrhea, unspecified: Secondary | ICD-10-CM

## 2013-11-01 DIAGNOSIS — Z0000 Encounter for general adult medical examination without abnormal findings: Secondary | ICD-10-CM

## 2013-11-01 LAB — TSH: TSH: 0.581 u[IU]/mL (ref 0.350–4.500)

## 2013-11-01 LAB — COMPREHENSIVE METABOLIC PANEL
ALT: 89 U/L — ABNORMAL HIGH (ref 0–35)
AST: 58 U/L — ABNORMAL HIGH (ref 0–37)
Albumin: 4 g/dL (ref 3.5–5.2)
Alkaline Phosphatase: 44 U/L (ref 39–117)
BUN: 23 mg/dL (ref 6–23)
CO2: 25 mEq/L (ref 19–32)
Calcium: 9 mg/dL (ref 8.4–10.5)
Chloride: 106 mEq/L (ref 96–112)
Creat: 0.7 mg/dL (ref 0.50–1.10)
Glucose, Bld: 111 mg/dL — ABNORMAL HIGH (ref 70–99)
Potassium: 4.4 mEq/L (ref 3.5–5.3)
Sodium: 138 mEq/L (ref 135–145)
Total Bilirubin: 0.6 mg/dL (ref 0.3–1.2)
Total Protein: 6.7 g/dL (ref 6.0–8.3)

## 2013-11-01 LAB — FOLLICLE STIMULATING HORMONE: FSH: 12.3 m[IU]/mL

## 2013-11-01 LAB — VITAMIN D 25 HYDROXY (VIT D DEFICIENCY, FRACTURES): Vit D, 25-Hydroxy: 26 ng/mL — ABNORMAL LOW (ref 30–89)

## 2013-11-01 LAB — HEMOGLOBIN, FINGERSTICK: Hemoglobin, fingerstick: 13.1 g/dL (ref 12.0–16.0)

## 2013-11-01 NOTE — Patient Instructions (Signed)

## 2013-11-01 NOTE — Progress Notes (Signed)
57 y.o. G3P2 DivorcedCaucasianF here for annual exam.  Doing well.  Cycling fairly regularly until the last few months.  Hasn't cycled since September.  No menopausal symptoms.     Patient's last menstrual period was 08/13/2013.          Sexually active: no  The current method of family planning is IUD.    Exercising: yes  gym and tennis Smoker:  no  Health Maintenance: Pap:  10/16/12 WNL/negative HR HPV History of abnormal Pap:  no MMG:  10/16/13 Diag MMG, screening 1 year Colonoscopy:  2/12 repeat in 5 years, Dr. Collene Mares BMD:   none TDaP:  5/14 Screening Labs: today, Hb today: 13.1, Urine today: PCP   reports that she has never smoked. She has never used smokeless tobacco. She reports that she drinks about 0.5 ounces of alcohol per week. She reports that she does not use illicit drugs.  Past Medical History  Diagnosis Date  . Ulcerative colitis   . Kidney stones     History reviewed. No pertinent past surgical history.  Current Outpatient Prescriptions  Medication Sig Dispense Refill  . levonorgestrel (MIRENA) 20 MCG/24HR IUD 1 each by Intrauterine route once.      . mesalamine (LIALDA) 1.2 G EC tablet Take 1,200 mg by mouth daily with breakfast.      . betamethasone valerate ointment (VALISONE) 0.1 % Apply topically 2 (two) times daily.  45 g  0  . mesalamine (CANASA) 1000 MG suppository Place 1,000 mg rectally at bedtime.       No current facility-administered medications for this visit.    Family History  Problem Relation Age of Onset  . Alzheimer's disease Mother   . Anemia Mother   . Heart murmur Father     ROS:  Pertinent items are noted in HPI.  Otherwise, a comprehensive ROS was negative.  Exam:   BP 112/68  Pulse 60  Resp 16  Ht 5' 7.5" (1.715 m)  Wt 161 lb 12.8 oz (73.392 kg)  BMI 24.95 kg/m2  LMP 08/13/2013  Weight change: +8lbs   Height: 5' 7.5" (171.5 cm)  Ht Readings from Last 3 Encounters:  11/01/13 5' 7.5" (1.715 m)  08/31/13 5' 7"  (1.702 m)   04/20/13 5' 7"  (1.702 m)    General appearance: alert, cooperative and appears stated age Head: Normocephalic, without obvious abnormality, atraumatic Neck: no adenopathy, supple, symmetrical, trachea midline and thyroid normal to inspection and palpation Lungs: clear to auscultation bilaterally Breasts: normal appearance, no masses or tenderness Heart: regular rate and rhythm Abdomen: soft, non-tender; bowel sounds normal; no masses,  no organomegaly Extremities: extremities normal, atraumatic, no cyanosis or edema Skin: Skin color, texture, turgor normal. No rashes or lesions Lymph nodes: Cervical, supraclavicular, and axillary nodes normal. No abnormal inguinal nodes palpated Neurologic: Grossly normal   Pelvic: External genitalia:  no lesions              Urethra:  normal appearing urethra with no masses, tenderness or lesions              Bartholins and Skenes: normal                 Vagina: normal appearing vagina with normal color and discharge, no lesions              Cervix: no lesions, 3cm IUD string noted              Pap taken: no Bimanual Exam:  Uterus:  normal  size, contour, position, consistency, mobility, non-tender              Adnexa: normal adnexa and no mass, fullness, tenderness               Rectovaginal: Confirms               Anus:  normal sphincter tone, no lesions  A:  Well Woman with normal exam Mirena IUD placed 1/14 Amenorrhea with IUD last 2-3 months H/O Vit D deficiency  P:   Mammogram yearly.  Has breast calcifications pap smear with neg HR HPV 2013 CMP, Vit D, TSH FSH return annually or prn  An After Visit Summary was printed and given to the patient.

## 2013-11-06 ENCOUNTER — Telehealth: Payer: Self-pay

## 2013-11-06 NOTE — Telephone Encounter (Signed)
Lmtcb//kn 

## 2013-11-06 NOTE — Telephone Encounter (Signed)
Message copied by Robley Fries on Tue Nov 06, 2013  1:31 PM ------      Message from: Megan Salon      Created: Sun Nov 04, 2013  2:42 PM       Inform CMP showed slightly elevated glucose (wasn't fasting) so ok.  Liver fxn tests were elevated mildly.  No ETOH and no tylenol.  Repeat 1 month.  If still elevated will need to see Dr. Collene Mares.  Order placed.  Vit D 26.  Needs to take 2000 IU daily.  Will recheck next yr.  [place] shows she is close to menopause.  Not there yet but getting close. ------

## 2013-11-14 NOTE — Telephone Encounter (Signed)
Returning call.

## 2013-11-14 NOTE — Telephone Encounter (Signed)
Patient notified

## 2013-11-23 NOTE — Telephone Encounter (Signed)
Patient aware.

## 2013-12-25 ENCOUNTER — Encounter: Payer: Self-pay | Admitting: Family Medicine

## 2014-07-09 ENCOUNTER — Telehealth: Payer: Self-pay

## 2014-07-09 NOTE — Telephone Encounter (Signed)
Yes.  She has been advised.  Encounter closed.

## 2014-07-09 NOTE — Telephone Encounter (Signed)
Message copied by Robley Fries on Tue Jul 09, 2014  8:54 AM ------      Message from: Robley Fries      Created: Wed Nov 14, 2013  4:01 PM       Needs repeat lab ------

## 2014-07-09 NOTE — Telephone Encounter (Signed)
Spoke with patient re: repeat liver testing from 1/15. Patient has been aware that this needs repeating, was not near her calendar to schedule at this time. I have left her several messages and spoken with her again, she states she will call back today to schedule. Is it ok to remove this from my in basket? Please advise.

## 2014-10-14 ENCOUNTER — Encounter: Payer: Self-pay | Admitting: Obstetrics & Gynecology

## 2014-11-26 ENCOUNTER — Encounter: Payer: Self-pay | Admitting: Obstetrics & Gynecology

## 2014-11-26 ENCOUNTER — Ambulatory Visit (INDEPENDENT_AMBULATORY_CARE_PROVIDER_SITE_OTHER): Payer: 59 | Admitting: Obstetrics & Gynecology

## 2014-11-26 VITALS — BP 120/78 | HR 60 | Resp 12 | Ht 67.0 in | Wt 162.8 lb

## 2014-11-26 DIAGNOSIS — Z01419 Encounter for gynecological examination (general) (routine) without abnormal findings: Secondary | ICD-10-CM

## 2014-11-26 DIAGNOSIS — Z124 Encounter for screening for malignant neoplasm of cervix: Secondary | ICD-10-CM

## 2014-11-26 NOTE — Progress Notes (Signed)
58 y.o. G3P2 DivorcedCaucasianF here for annual exam.  Doing well.  Had recent URI.  Getting better.  No vaginal bleeding.  Had IUD.  Saw Dr. Collene Mares.  H/O ulcerative colitis.    No LMP recorded. Patient is not currently having periods (Reason: IUD).  No cycle in the last 5 months       Sexually active: No.  The current method of family planning is IUD.    Exercising: Yes.    tennis, yoga, running, and weights Smoker:  no  Health Maintenance: Pap:  10/16/12 WNL/negative HR HPV History of abnormal Pap:  no MMG:  10/16/13-diag-screening in one year Colonoscopy:  2/12-repeat in 5 years Dr Collene Mares BMD:   none TDaP:  5/14   Screening Labs: Dr Collene Mares, Hb today: 12.2 (4/14), Urine today: not today   reports that she has never smoked. She has never used smokeless tobacco. She reports that she drinks about 0.5 - 1.0 oz of alcohol per week. She reports that she does not use illicit drugs.  Past Medical History  Diagnosis Date  . Ulcerative colitis   . Kidney stones   . Vitamin D deficiency     No past surgical history on file.  Current Outpatient Prescriptions  Medication Sig Dispense Refill  . levonorgestrel (MIRENA) 20 MCG/24HR IUD 1 each by Intrauterine route once.    . mesalamine (CANASA) 1000 MG suppository Place 1,000 mg rectally at bedtime.    . mesalamine (LIALDA) 1.2 G EC tablet Take 1,200 mg by mouth daily with breakfast.     No current facility-administered medications for this visit.    Family History  Problem Relation Age of Onset  . Alzheimer's disease Mother   . Anemia Mother   . Heart murmur Father     ROS:  Pertinent items are noted in HPI.  Otherwise, a comprehensive ROS was negative.  Exam:   BP 120/78 mmHg  Pulse 60  Resp 12  Ht 5' 7"  (1.702 m)  Wt 162 lb 12.8 oz (73.846 kg)  BMI 25.49 kg/m2  LMP     Height: 5' 7"  (170.2 cm)  Ht Readings from Last 3 Encounters:  11/26/14 5' 7"  (1.702 m)  11/01/13 5' 7.5" (1.715 m)  08/31/13 5' 7"  (1.702 m)    General  appearance: alert, cooperative and appears stated age Head: Normocephalic, without obvious abnormality, atraumatic Neck: no adenopathy, supple, symmetrical, trachea midline and thyroid normal to inspection and palpation Lungs: clear to auscultation bilaterally Breasts: normal appearance, no masses or tenderness Heart: regular rate and rhythm Abdomen: soft, non-tender; bowel sounds normal; no masses,  no organomegaly Extremities: extremities normal, atraumatic, no cyanosis or edema Skin: Skin color, texture, turgor normal. No rashes or lesions Lymph nodes: Cervical, supraclavicular, and axillary nodes normal. No abnormal inguinal nodes palpated Neurologic: Grossly normal   Pelvic: External genitalia:  no lesions              Urethra:  normal appearing urethra with no masses, tenderness or lesions              Bartholins and Skenes: normal                 Vagina: normal appearing vagina with normal color and discharge, no lesions              Cervix: no lesions              Pap taken: Yes.   Bimanual Exam:  Uterus:  normal size, contour, position,  consistency, mobility, non-tender              Adnexa: normal adnexa and no mass, fullness, tenderness               Rectovaginal: Confirms               Anus:  normal sphincter tone, no lesions  Chaperone was present for exam.  A:  Well Woman with normal exam Mirena IUD placed 1/14 Amenorrhea with IUD.   H/O Vit D deficiency.  Declines labs today. H/o ulcerative colitis.  Followed by Dr. Collene Mares.  P: Mammogram yearly. Has breast calcifications pap smear with neg HR HPV 2013.  Pap today. Will get copies of Dr. Lorie Apley labs for comparison.   return annually or prn

## 2014-11-28 LAB — IPS PAP TEST WITH REFLEX TO HPV

## 2014-12-23 ENCOUNTER — Telehealth: Payer: Self-pay

## 2014-12-23 NOTE — Telephone Encounter (Signed)
Lmtcb//kn 

## 2015-01-20 NOTE — Telephone Encounter (Signed)
Left patient detailed message, ok per DPR, with confirmation from Dr Collene Mares that she will continue long term on her UC medication. Aware to call back with any questions.//kn

## 2015-02-19 ENCOUNTER — Other Ambulatory Visit: Payer: Self-pay

## 2015-04-05 IMAGING — CT CT ABD-PELV W/O CM
3 of 4 series · 13 of 36 positions shown, 19 images · non-contrast
Comparison: None

CLINICAL DATA: Kidney stone protocol

CT ABDOMEN AND PELVIS WITHOUT CONTRAST
TECHNIQUE: Multidetector CT imaging of the abdomen and pelvis was
performed following the standard protocol without intravenous
contrast.

[Series 3: renal stone · axial · 0.93mm/px · z∈[-436,-90]mm · 8 of 89 slices shown, 13 images]
[im 10/89  soft-tissue]
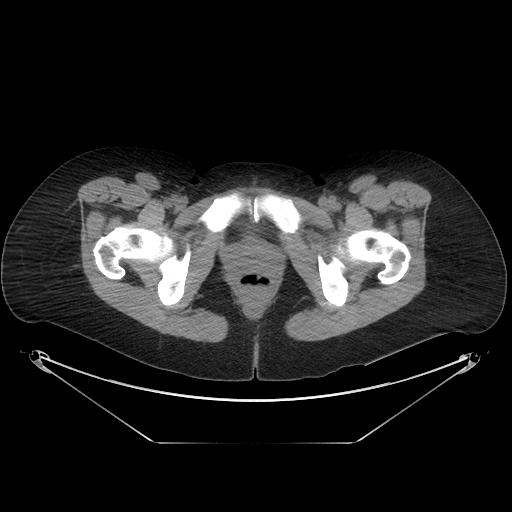
[im 10/89  bone]
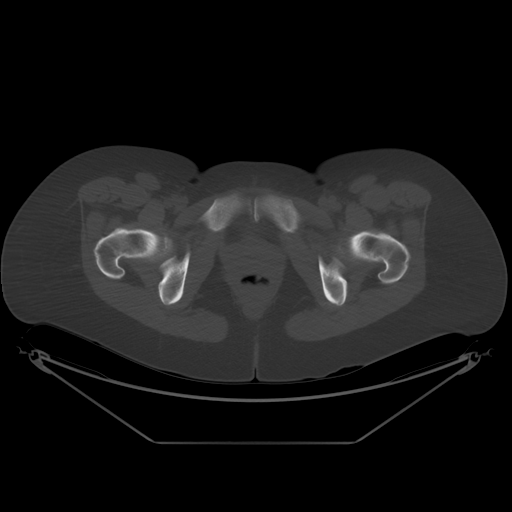
[im 20/89  soft-tissue]
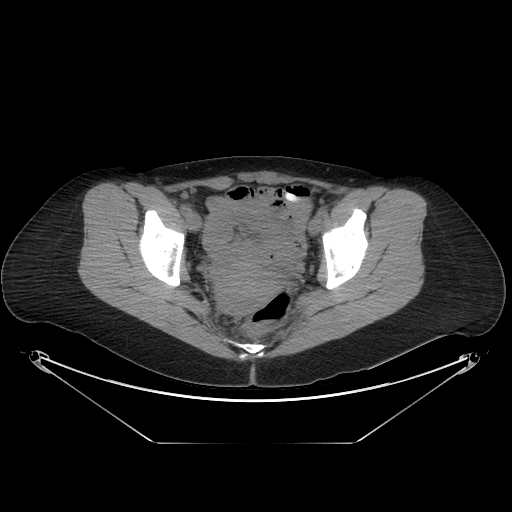
[im 30/89  soft-tissue]
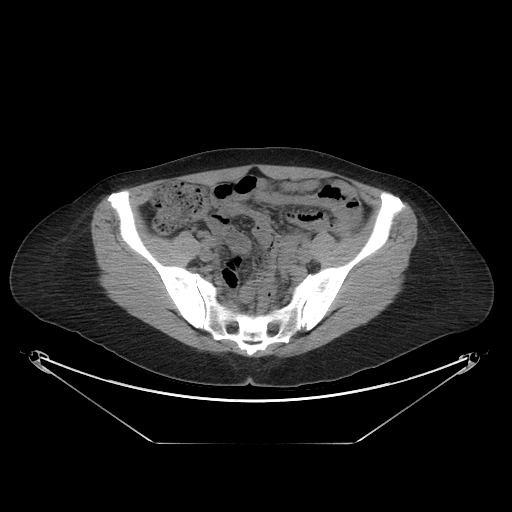
[im 40/89  soft-tissue]
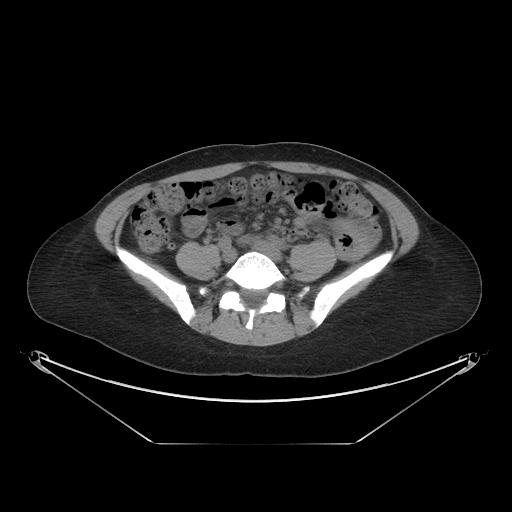
[im 49/89  soft-tissue]
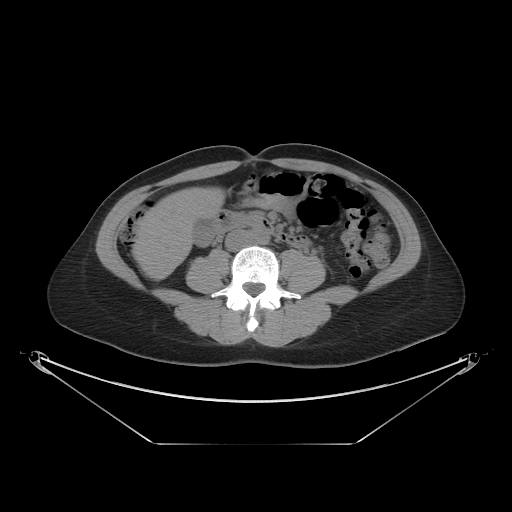
[im 49/89  lung]
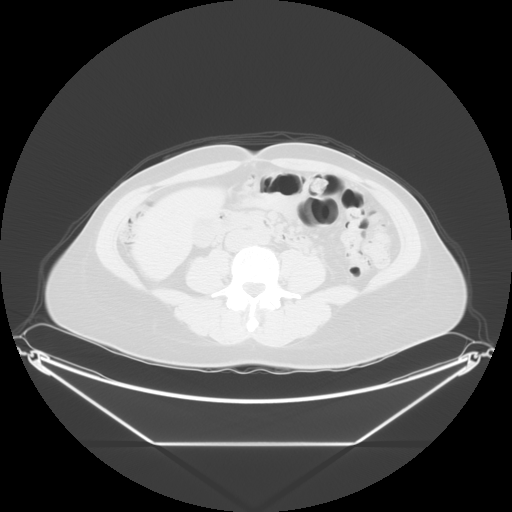
[im 59/89  soft-tissue]
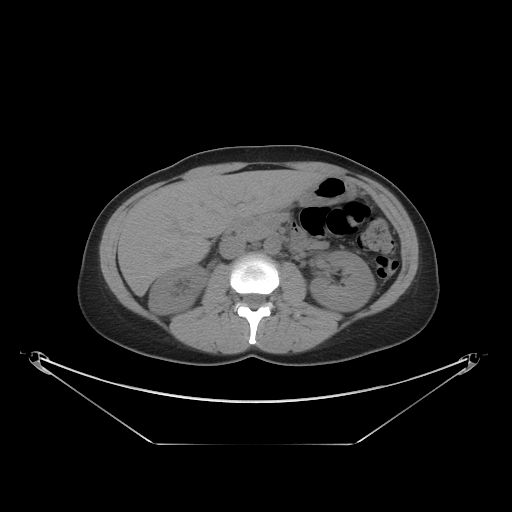
[im 59/89  lung]
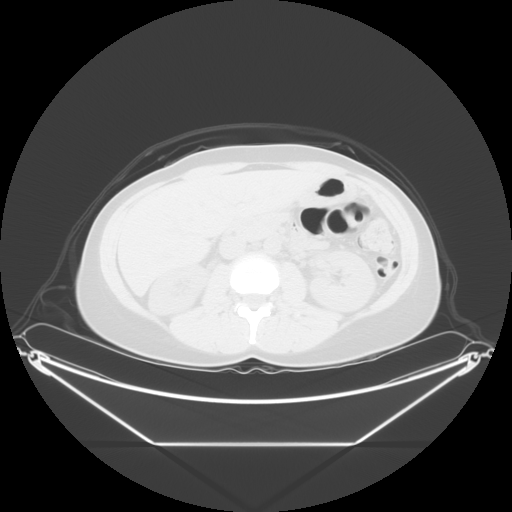
[im 69/89  soft-tissue]
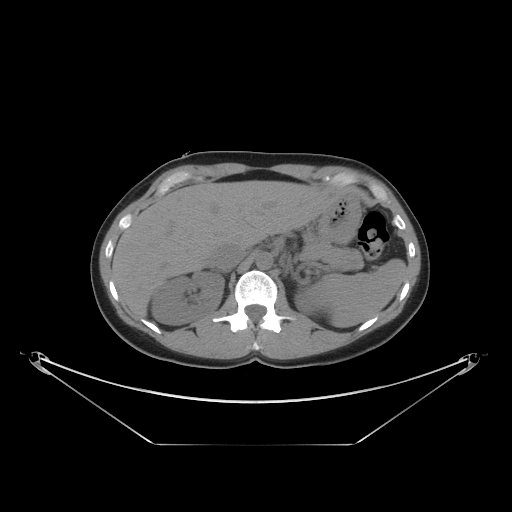
[im 69/89  lung]
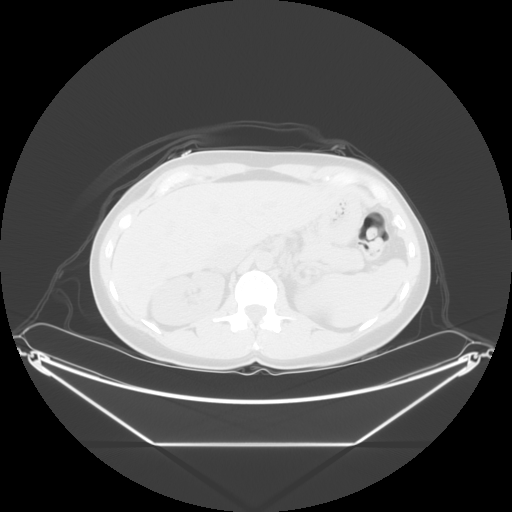
[im 79/89  soft-tissue]
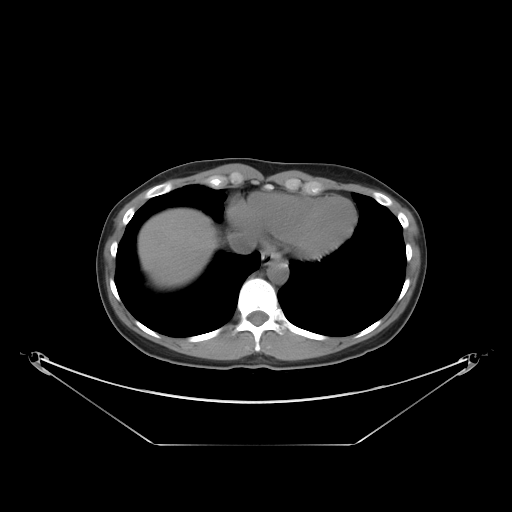
[im 79/89  lung]
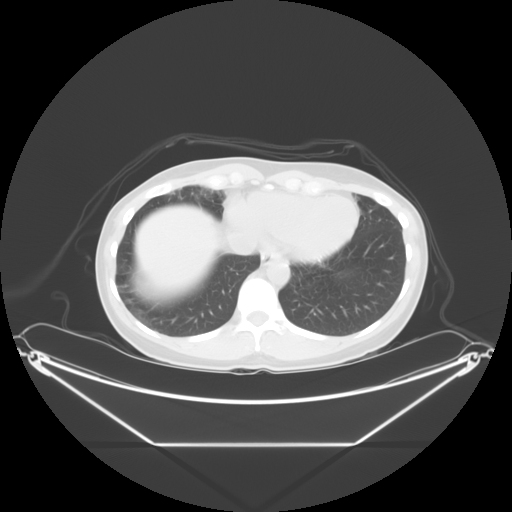

[Series 601: coronal body · coronal · 0.93mm/px · 1 of 122 slices shown, 2 images]
[im 41/122  soft-tissue]
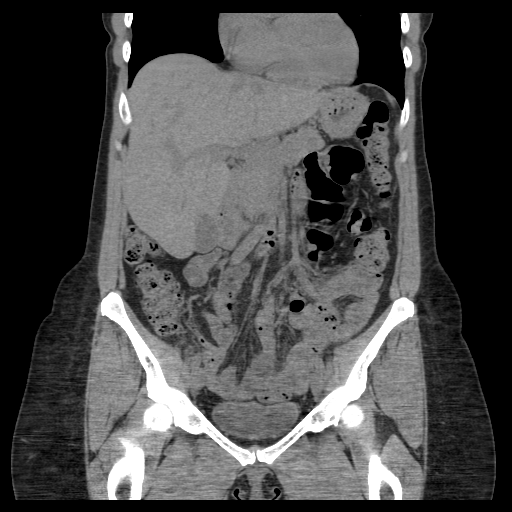
[im 41/122  bone]
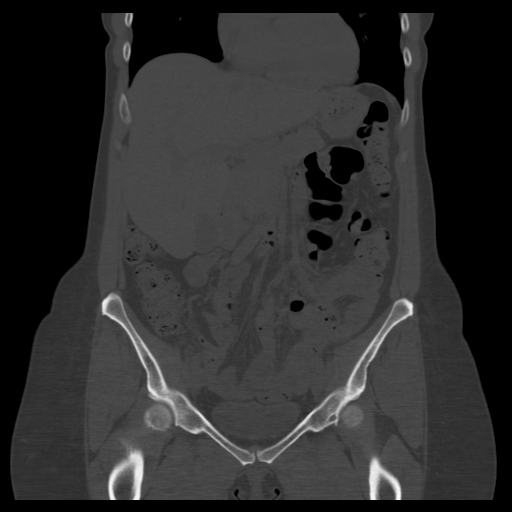

[Series 602: sagittal body · sagittal · 0.93mm/px · 4 of 190 slices shown]
[im 20/190  soft-tissue]
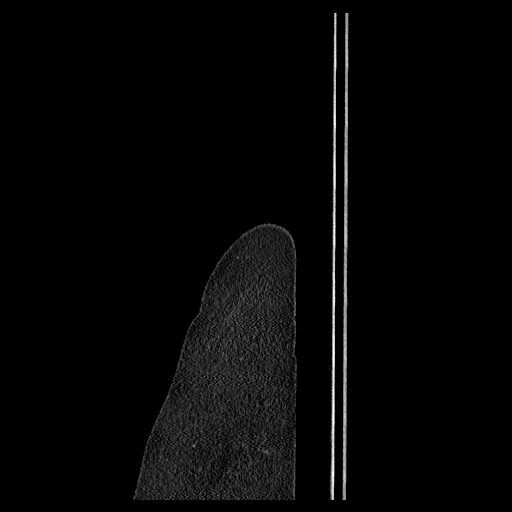
[im 40/190  soft-tissue]
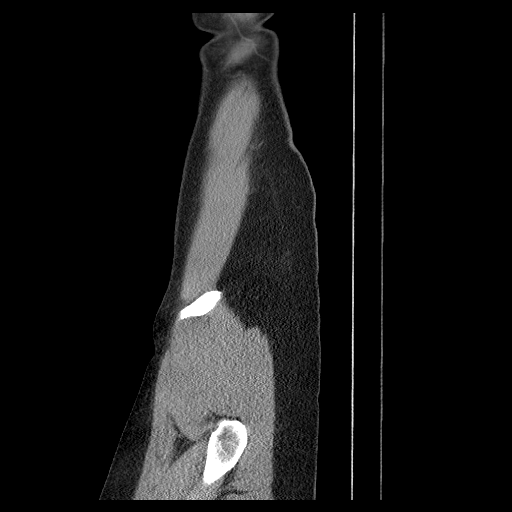
[im 60/190  soft-tissue]
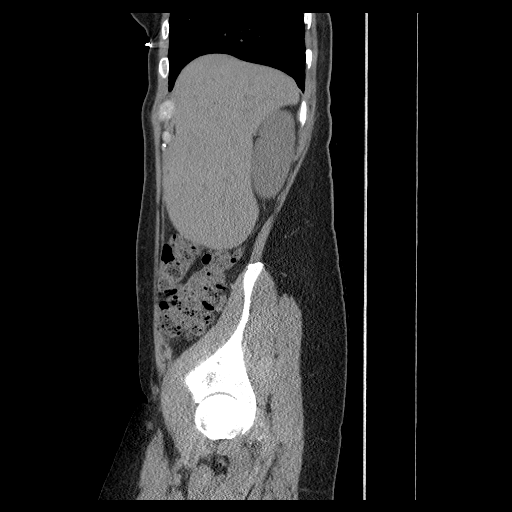
[im 80/190  soft-tissue]
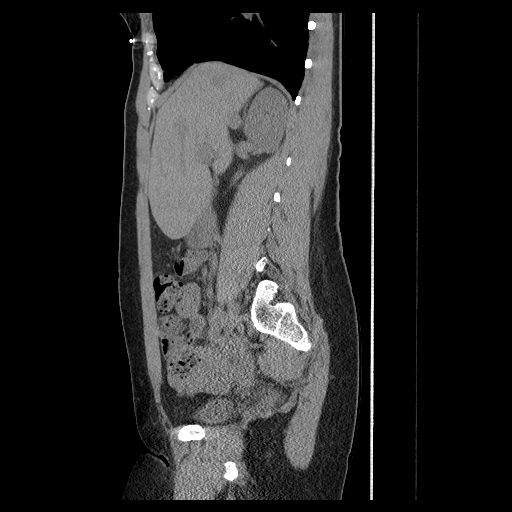

[13 of 36 positions shown; findings below may reference images not displayed]

FINDINGS: The lung bases are clear.  No pericardial or pleural
effusion.  There is no focal liver abnormality identified.  The
gallbladder appears within normal limits.  Normal appearance of the
pancreas.  The spleen is normal.

The adrenal glands are both unremarkable.  Mild periureteral fat
stranding is identified involving the proximal right ureter. No
significant hydronephrosis is identified.  There is a stone within
the urinary bladder which measures 5 mm, image 76/series 3.  IUD is
identified within the uterine cavity.

Normal caliber of the abdominal aorta.  No aneurysm.  There is no
upper abdominal adenopathy.  There is no pelvic or inguinal
adenopathy.  There is no free fluid or fluid collections
identified.

The stomach is normal.  The small bowel loops are negative.  Normal
appearance of the appendix.  The colon appears unremarkable.

Review of the visualized osseous structures is unremarkable.
IMPRESSION: 1.  Stone is noted within the urinary bladder measuring 5 mm.
There is mild stranding surrounding the proximal right ureter.  No
significant hydronephrosis.

## 2015-11-17 ENCOUNTER — Telehealth: Payer: Self-pay | Admitting: Obstetrics & Gynecology

## 2015-11-17 NOTE — Telephone Encounter (Signed)
Left message to call [place] at [phone].  Letter written. Pending review with patient that her last aex was on 11/26/2014. Is this okay for her letter to her employer? Patient is not scheduled for next aex until 01/16/2016.

## 2015-11-17 NOTE — Telephone Encounter (Signed)
Patient needs a letter saying she was seen for gyn visit this year signed by Dr. Sabra Heck. Patient said the deadline is today and if it can be faxed to her today if possible. Fax # [phone] Best # to reach patient: [phone]

## 2015-11-17 NOTE — Telephone Encounter (Signed)
Signed letter faxed to patient directly at verified fax number [phone] with cover sheet and confirmation. Patient notified and is agreeable. Will contact the office if she needs anything additional.  Routing to provider for final review. Patient agreeable to disposition. Will close encounter.

## 2015-11-17 NOTE — Telephone Encounter (Signed)
Spoke with patient. Patient states she is unsure of date requirements for her letter to her employer. Patient would like to submit the letter with her last aex date on 11/26/2014. Letter written and printed to [place] [place] for review and signature before faxing.

## 2015-11-18 ENCOUNTER — Telehealth: Payer: Self-pay | Admitting: Obstetrics & Gynecology

## 2015-11-18 ENCOUNTER — Encounter: Payer: Self-pay | Admitting: Obstetrics & Gynecology

## 2015-11-18 NOTE — Telephone Encounter (Signed)
Letter edited and reprinted with date of last aex as 11/26/2014. Letter to [place] for review and signature before fax.

## 2015-11-18 NOTE — Telephone Encounter (Signed)
Patient calling stating, "My wellness form completed earlier this week has the incorrect date of my last visit for an annual exam. Will you please fix this and re-fax this?"

## 2015-11-18 NOTE — Telephone Encounter (Signed)
Spoke with patient. Advised revised letter with aex date of 11/26/2014 and DOB has been faxed to [phone] with cover sheet and confirmation. Patient is agreeable.

## 2015-12-25 LAB — HEPATIC FUNCTION PANEL
AST: 20 U/L (ref 13–35)
Alkaline Phosphatase: 69 U/L (ref 25–125)
Bilirubin, Direct: 0.09 mg/dL
Bilirubin, Total: 0.3 mg/dL

## 2016-01-07 ENCOUNTER — Encounter: Payer: Self-pay | Admitting: Family Medicine

## 2016-01-14 ENCOUNTER — Telehealth: Payer: Self-pay | Admitting: Family Medicine

## 2016-01-14 DIAGNOSIS — Z1231 Encounter for screening mammogram for malignant neoplasm of breast: Secondary | ICD-10-CM

## 2016-01-14 NOTE — Telephone Encounter (Signed)
Spoke with patient and she is going to call [facility] and schedule her mammogram.

## 2016-01-15 ENCOUNTER — Ambulatory Visit (INDEPENDENT_AMBULATORY_CARE_PROVIDER_SITE_OTHER): Payer: BLUE CROSS/BLUE SHIELD | Admitting: Family Medicine

## 2016-01-15 ENCOUNTER — Encounter: Payer: Self-pay | Admitting: Family Medicine

## 2016-01-15 VITALS — BP 106/70 | HR 71 | Temp 97.9°F | Resp 16 | Ht 67.0 in | Wt 167.0 lb

## 2016-01-15 DIAGNOSIS — E663 Overweight: Secondary | ICD-10-CM

## 2016-01-15 DIAGNOSIS — M25541 Pain in joints of right hand: Secondary | ICD-10-CM | POA: Diagnosis not present

## 2016-01-15 DIAGNOSIS — K5190 Ulcerative colitis, unspecified, without complications: Secondary | ICD-10-CM

## 2016-01-15 DIAGNOSIS — N951 Menopausal and female climacteric states: Secondary | ICD-10-CM

## 2016-01-15 DIAGNOSIS — M25542 Pain in joints of left hand: Secondary | ICD-10-CM | POA: Diagnosis not present

## 2016-01-15 DIAGNOSIS — J3489 Other specified disorders of nose and nasal sinuses: Secondary | ICD-10-CM

## 2016-01-15 DIAGNOSIS — Z23 Encounter for immunization: Secondary | ICD-10-CM

## 2016-01-15 NOTE — Patient Instructions (Addendum)
I will ask around about a good dermatologist and then let you know - if you find one I'll be happy to place a referral.   After you see Dr. Sabra Heck and get a verdict on your menopausal status, discussions about when to take out your IUD or if you choose to pursue any hormone replacement therapy might have an impact on your weight. If over the next several months you are still not seeing any change than over the spring and summer could be a good time to try a temporary weight loss medication while you can continue to maximize all the great lifestyle choices you are making so far.  I have seen some good response to the new Belviq. Topamax, wellbutrin, naltrexone, and victoza are some of the other non-stimulant weight loss medications - none of these were made for weight loss nor FDA approved for this purpose but are still used for this in some people when appropriate.  Because it is off-label usage, often insurance does not cover the medications.   Start mucinex twice a day, nasal saline spray every 1-2 hrs, and a cool-mist humidifier by your bed at night.  In several days try a netti pot (keeping the left side of your face down).  If it is not working, let me know.  For your thumbs - most likely is a type of tenosynovitis - or inflammation of the thumb extensor tendon. See below - are you more likely to have the pain on days you play tennis?  Or have been writing a lot? Seeing what makes it worse and what it responds to can help. If the pain is ever severe come to the walk-in while you have it so we can try to localize it on exam.   Alfonse Ras Disease Alfonse Ras disease is inflammation of the tendon on the thumb side of the wrist. Tendons are cords of tissue that connect bones to muscles. The tendons in your hand pass through a tunnel, or sheath. A slippery layer of tissue (synovium) lets the tendons move smoothly in the sheath. With de Quervain disease, the sheath swells or thickens, causing friction and  pain. The condition is also called de Quervain tendinosis and de Quervain syndrome. It occurs most often in women who are 57-35 years old. CAUSES  The exact cause of de Quervain disease is not known. It may result from:   Overusing your hands, especially with repetitive motions that involve twisting your hand or using a forceful grip.  Pregnancy.  Rheumatoid disease. RISK FACTORS You may have a greater risk for de Quervain disease if you:  Are a middle-aged woman.  Are pregnant.  Have rheumatoid arthritis.  Have diabetes.  Use your hands far more than normal, especially with a tight grip or excessive twisting. SIGNS AND SYMPTOMS Pain on the thumb side of your wrist is the main symptom of de Quervain disease. Other signs and symptoms include:  Pain that gets worse when you grasp something or turn your wrist.  Pain that extends up the forearm.  Cysts in the area of the pain.  Swelling of your wrist and hand.  A sensation of snapping in the wrist.  Trouble moving the thumb and wrist. DIAGNOSIS  Your health care provider may diagnose de Quervain disease based on your signs and symptoms. A physical exam will also be done. A simple test Wynn [place]lin test) that involves pulling your thumb and wrist to see if this causes pain can help determine whether you have  the condition. Sometimes you may need to have an X-ray.  TREATMENT  Avoiding any activity that causes pain and swelling is the best treatment. Other options include:  Wearing a splint.  Taking medicine. Anti-inflammatory medicines and corticosteroid injections may reduce inflammation and relieve pain.  Having surgery if other treatments do not work. HOME CARE INSTRUCTIONS   Using ice can be helpful after doing activities that involve the sore wrist. To apply ice to the injured area:  Put ice in a plastic bag.  Place a towel between your skin and the bag.  Leave the ice on for 20 minutes, 2-3 times a day.  Take  medicines only as directed by your health care provider.  Wear your splint as directed. This will allow your hand to rest and heal. SEEK MEDICAL CARE IF:   Your pain medicine does not help.   Your pain gets worse.  You develop new symptoms. MAKE SURE YOU:   Understand these instructions.  Will watch your condition.  Will get help right away if you are not doing well or get worse.   This information is not intended to replace advice given to you by your health care provider. Make sure you discuss any questions you have with your health care provider.   Document Released: 08/24/2001 Document Revised: 12/20/2014 Document Reviewed: 04/03/2014 Elsevier Interactive Patient Education [employer].

## 2016-01-15 NOTE — Progress Notes (Signed)
Subjective:    Patient ID: Mia Jordan, female    DOB: 1956/12/01, 60 y.o.   MRN: [id number] Chief Complaint  Patient presents with  . Follow-up    well check per pt    HPI  Mia Jordan has been doing well.  Her daughter has a great job as an Programme researcher, broadcasting/film/video in [place] and her son is in his second college semester in [place] and doing well. Readjusting to living alone - painting the house.  Is making the rounds to see her physicians  - saw Dr. Collene Mares, GI last mo and her gynecologist Dr. Sabra Heck next wk.  Is not fasting today. Had blood work done at Dr. Lorie Apley office and sent here - nml cmp, cbc, tsh.  Was told her cholesterol was amazing prev.  Is not due for her colonoscopy until next yr.  Apparently her ulcerative colitis is in remission - has really not been an issue.  Taking Lialdo daily - was initially supposed to be on it bid but hasn't had to.  She then takes the same med in a supporsotry three times/wk but isn't the best about that compliance.    Has an appt with gyn Dr. Sabra Heck next wk.  Has a 5 yr IUD in place - having intermittent periods - becoming a little less freq - wonders when she is going to go through menopause - Dr. Sabra Heck checked her gonadotropin levels last yr and still looked good but suspects she might be more perimenopausal this yr - has very rare hot flashes but seem to be subsiding - if they occur usually just at night.    Taking mvi for post-menopausal women.  Also on Fish oil, mvi, vit D, probiotic. Occ Ibuprofen, no other otc meds, supp, herbals, vit, etc.  Is feeling like her weight is stuck - she does a weight lifing class twice a week, plays tennis, runs somes  - tries to follow really healthy diet. She is not exercising to an extreme to put in a huge amount of effort to weight loss nor really restricting herself but does exercise daily, mainly veggies, salads, low carb, low sugar.  Ongoing sinus infection for this whole winter- nose is really dry - is scabby - blowing  nose but seems to be more drainage, can be bloody, no nasal sprays. Humidifier broke.  On random days, she will develop severe pain along the radial aspect of the first metacarpal.  At times she will just wake up with soreness and stiffness along either 1st or 5th metacarpals but and can't identify any triggering factor - 1st is much more common than 5th, will vary hands.   Will usually be relieved the next day. No edema, rashes, skin changes, erythema or warmth but does have limited ROM and strength during flairs.  Requests recommendations on a good dermatologist for routine skin check - would like to see someone who is patient and thorough - has seen Dr. Mickel Baas lomax and dr. [place] Crane in the past but those visits always seem rushed.  Depression screen PHQ 2/9 01/15/2016  Decreased Interest 0  Down, Depressed, Hopeless 0  PHQ - 2 Score 0   Past Medical History  Diagnosis Date  . Ulcerative colitis ([place])   . Kidney stones   . Vitamin D deficiency    No past surgical history on file. Current Outpatient Prescriptions on File Prior to Visit  Medication Sig Dispense Refill  . levonorgestrel (MIRENA) 20 MCG/24HR IUD 1 each by Intrauterine route once.    Marland Kitchen  mesalamine (CANASA) 1000 MG suppository Place 1,000 mg rectally at bedtime.    . mesalamine (LIALDA) 1.2 G EC tablet Take 1,200 mg by mouth daily with breakfast.     No current facility-administered medications on file prior to visit.   No Known Allergies Family History  Problem Relation Age of Onset  . Alzheimer's disease Mother   . Anemia Mother   . Heart murmur Father    Social History   Social History  . Marital Status: Divorced    Spouse Name: N/A  . Number of Children: N/A  . Years of Education: N/A   Social History Main Topics  . Smoking status: Never Smoker   . Smokeless tobacco: Never Used  . Alcohol Use: 0.0 oz/week    0 Standard drinks or equivalent per week     Comment: socially   . Drug Use: No  . Sexual Activity: No     Other Topics Concern  . None   Social History Narrative     Review of Systems  Constitutional: Positive for unexpected weight change. Negative for fever, chills, activity change, appetite change and fatigue.  HENT: Positive for nosebleeds. Negative for congestion, ear pain, facial swelling, mouth sores, postnasal drip, rhinorrhea, sinus pressure, sore throat, tinnitus, trouble swallowing and voice change.   Gastrointestinal: Negative for nausea, vomiting, abdominal pain, constipation, blood in stool, abdominal distention and anal bleeding.  Genitourinary: Positive for vaginal bleeding. Negative for dysuria, frequency, hematuria, flank pain, vaginal discharge, enuresis, genital sores, vaginal pain, menstrual problem and pelvic pain.  Skin: Negative for rash.  Neurological: Negative for weakness and numbness.  Hematological: Negative for adenopathy.  Psychiatric/Behavioral: Negative for sleep disturbance, dysphoric mood and decreased concentration. The patient is not nervous/anxious.        Objective:  BP 106/70 mmHg  Pulse 71  Temp(Src) 97.9 F (36.6 C)  Resp 16  Ht 5' 7"  (1.702 m)  Wt 167 lb (75.751 kg)  BMI 26.15 kg/m2  Physical Exam  Constitutional: She is oriented to person, place, and time. She appears well-developed and well-nourished. No distress.  HENT:  Head: Normocephalic and atraumatic.  Right Ear: External ear normal.  Left Ear: External ear normal.  Eyes: Conjunctivae are normal. No scleral icterus.  Neck: Normal range of motion. Neck supple. No thyromegaly present.  Cardiovascular: Normal rate, regular rhythm, normal heart sounds and intact distal pulses.   Pulmonary/Chest: Effort normal and breath sounds normal. No respiratory distress.  Musculoskeletal: She exhibits no edema.  Lymphadenopathy:    She has no cervical adenopathy.  Neurological: She is alert and oriented to person, place, and time.  Skin: Skin is warm and dry. She is not diaphoretic. No  erythema.  Psychiatric: She has a normal mood and affect. Her behavior is normal.          Assessment & Plan:   1. Need for prophylactic vaccination and inoculation against influenza   2. Need for prophylactic vaccination and inoculation against viral hepatitis    Refuses flu shot mult times.  Discussed post-menopausal hormone replacement therapy - she can talk with her gyn Dr. Sabra Heck about whether this is a decent option for her over the next few yrs (<5) - birth control pills or new IUD.  Reviewed poss of low dose topical estrogen to vagina for mucosal dryness/atrophy.  Reviewed weight loss options - after discussion w/ gyn, would be candidate for a short-term medication therapy - could consider belviq while increasing focus on diet/exercise. Could cons victoza, topamax, neltrexone,  wellbutrin.  Start nasal saline sev times a day, try netti pot, get cool-mist humidifier. If continues, may need to consider intranasal bactroban or even abx or ENT referral   Orders Placed This Encounter  Procedures  . Hepatitis B vaccine adult IM  . Hepatitis A vaccine adult IM   Over 40 min spent in face-to-face evaluation of and consultation with patient and coordination of care.  Over 50% of this time was spent counseling this patient.  Delman Cheadle, MD MPH

## 2016-01-16 ENCOUNTER — Ambulatory Visit (INDEPENDENT_AMBULATORY_CARE_PROVIDER_SITE_OTHER): Payer: BLUE CROSS/BLUE SHIELD | Admitting: Obstetrics & Gynecology

## 2016-01-16 ENCOUNTER — Encounter: Payer: Self-pay | Admitting: Obstetrics & Gynecology

## 2016-01-16 VITALS — BP 110/76 | HR 62 | Resp 16 | Ht 67.0 in | Wt 170.0 lb

## 2016-01-16 DIAGNOSIS — Z01419 Encounter for gynecological examination (general) (routine) without abnormal findings: Secondary | ICD-10-CM

## 2016-01-16 DIAGNOSIS — E2839 Other primary ovarian failure: Secondary | ICD-10-CM

## 2016-01-16 DIAGNOSIS — Z205 Contact with and (suspected) exposure to viral hepatitis: Secondary | ICD-10-CM

## 2016-01-16 DIAGNOSIS — R635 Abnormal weight gain: Secondary | ICD-10-CM | POA: Diagnosis not present

## 2016-01-16 DIAGNOSIS — N924 Excessive bleeding in the premenopausal period: Secondary | ICD-10-CM

## 2016-01-16 NOTE — Progress Notes (Signed)
60 y.o. G3P2 DivorcedCaucasianF here for annual exam.  Pt doing well but frustrated with her weight. Did discuss with Dr. Brigitte Pulse this same issue and she discussed weight loss medications with pt.  She did have blood work in mid January.  Reports all of this was normal.    Still cycling a little each month.  Usually flow is for two days, no clots.  H/O mirena IUD placed 1/14.  Ulcerative colitis is under good control.  Patient's last menstrual period was 11/27/2015.          Sexually active: No The current method of family planning is IUD.    Exercising: Yes.    yoga, tennis, running, weights, horseback riding Smoker:  no  Health Maintenance: Pap: 11/26/14 Neg. 10/16/12 Neg. HR HPV:neg History of abnormal Pap:  No MMG:  10/16/13 BIRADS2:Benign.  Pt aware OVERDUE. Colonoscopy:  01/26/12 Dr. Collene Mares- Polyps. Due 2018 BMD:   Never TDaP:  04/2013  Screening Labs: PCP, Hb today: PCP, Urine today: PCP   reports that she has never smoked. She has never used smokeless tobacco. She reports that she drinks alcohol. She reports that she does not use illicit drugs.  Past Medical History  Diagnosis Date  . Ulcerative colitis ([place])   . Kidney stones   . Vitamin D deficiency     History reviewed. No pertinent past surgical history.  Current Outpatient Prescriptions  Medication Sig Dispense Refill  . levonorgestrel (MIRENA) 20 MCG/24HR IUD 1 each by Intrauterine route once.    . mesalamine (CANASA) 1000 MG suppository Place 1,000 mg rectally at bedtime.    . mesalamine (LIALDA) 1.2 G EC tablet Take 1,200 mg by mouth daily with breakfast.     No current facility-administered medications for this visit.    Family History  Problem Relation Age of Onset  . Alzheimer's disease Mother   . Anemia Mother   . Heart murmur Father     ROS:  Pertinent items are noted in HPI.  Otherwise, a comprehensive ROS was negative.  Exam:   BP 110/76 mmHg  Pulse 62  Resp 16  Ht 5' 7"  (1.702 m)  Wt 170 lb  (77.111 kg)  BMI 26.62 kg/m2  LMP 11/27/2015  Weight change:  +8# Height: 5' 7"  (170.2 cm)  Ht Readings from Last 3 Encounters:  01/16/16 5' 7"  (1.702 m)  01/15/16 5' 7"  (1.702 m)  11/26/14 5' 7"  (1.702 m)    General appearance: alert, cooperative and appears stated age Head: Normocephalic, without obvious abnormality, atraumatic Neck: no adenopathy, supple, symmetrical, trachea midline and thyroid normal to inspection and palpation Lungs: clear to auscultation bilaterally Breasts: normal appearance, no masses or tenderness Heart: regular rate and rhythm Abdomen: soft, non-tender; bowel sounds normal; no masses,  no organomegaly Extremities: extremities normal, atraumatic, no cyanosis or edema Skin: Skin color, texture, turgor normal. No rashes or lesions Lymph nodes: Cervical, supraclavicular, and axillary nodes normal. No abnormal inguinal nodes palpated Neurologic: Grossly normal   Pelvic: External genitalia:  no lesions              Urethra:  normal appearing urethra with no masses, tenderness or lesions              Bartholins and Skenes: normal                 Vagina: normal appearing vagina with normal color and discharge, no lesions              Cervix: no  lesions              Pap taken: No. Bimanual Exam:  Uterus:  normal size, contour, position, consistency, mobility, non-tender              Adnexa: normal adnexa and no mass, fullness, tenderness               Rectovaginal: Confirms               Anus:  normal sphincter tone, no lesions  Chaperone was present for exam.  A:  Well Woman with normal exam Mirena IUD placed 1/14 H/o ulcerative colitis. Followed by Dr. Collene Mares. Frustration with weight.  Not interested in medication or any weight loss program.  Would like to see a nutritionist.    P: Mammogram yearly. Has hx of breast calcifications pap smear with neg HR HPV 2013. Neg 2015.  No pap today. FSH and estradiol today Hep C antibody.  Other labs done  with Dr. Collene Mares and reviewed in [facility]. Nutritionist referral placed after calling and discussing appropriate nutritionists for pt.  They will call her directly. return annually or prn

## 2016-01-16 NOTE — Patient Instructions (Signed)
Schedule your mammogram and your bone density.

## 2016-01-17 LAB — FOLLICLE STIMULATING HORMONE: FSH: 53 m[IU]/mL

## 2016-01-17 LAB — HEPATITIS C ANTIBODY: HCV Ab: NEGATIVE

## 2016-01-17 LAB — ESTRADIOL: Estradiol: 14.3 pg/mL

## 2016-01-17 MED ORDER — MUPIROCIN 2 % EX OINT: 1.0000 "application " | TOPICAL_OINTMENT | Freq: Three times a day (TID) | CUTANEOUS | Status: DC

## 2016-01-22 ENCOUNTER — Telehealth: Payer: Self-pay | Admitting: Emergency Medicine

## 2016-01-22 DIAGNOSIS — R635 Abnormal weight gain: Secondary | ICD-10-CM

## 2016-01-22 NOTE — Telephone Encounter (Signed)
Message left to return call to Ndea Kilroy at [phone].    

## 2016-01-22 NOTE — Telephone Encounter (Signed)
-----   Message from Megan Salon, MD sent at 01/22/2016  7:02 AM EST ----- Please let pt know that her Hep C testing was negative.  Her [place] was 53 and estradiol level was 14.  She should not be bleeding with these levels.  Needs SHGM with biopsy.

## 2016-02-03 NOTE — Telephone Encounter (Signed)
Dr. Sabra Heck  Does patient need to plan for Mirena IUD removal with next annual exam?

## 2016-02-03 NOTE — Telephone Encounter (Signed)
I still feel at 77 and with FSH of 53, she should not have bleeding.  Would still recommend ultrasound and biopsy.  If pt declines, she declines, but that is not my recommendation.  Thanks.

## 2016-02-03 NOTE — Telephone Encounter (Signed)
Call again to patient. She is given results and recommendations from Dr. Sabra Heck.  Patient advised of procedures recommended and discussed indication of procedures to evaluate for any presence of abnormal cells or precancerous changes of the uterine lining, infection, polyps or fibroids which could cause her to have post menopausal bleeding. Patient declines to schedule at this time. She states she has not had any vaginal bleeding since 11/27/15 and that she will monitor for any further bleeding. She has Mirena IUD.  Advised patient will send message to Dr. Sabra Heck with her response and if any additional message from Dr. Sabra Heck would return call. Patient agreeable.

## 2016-02-05 NOTE — Telephone Encounter (Signed)
It's not due out until 2019.  Typically I leave it in until the five years has passed.  She and I will discuss this at her AEX and if she wants it out then, I can remove it.

## 2016-02-09 NOTE — Telephone Encounter (Signed)
Call to patient. Unable to leave voice mail. Will try again.

## 2016-02-16 NOTE — Telephone Encounter (Signed)
Call to patient and phone states "due to network difficulties the caller cannot be reached."

## 2016-03-02 ENCOUNTER — Encounter: Payer: Self-pay | Admitting: Obstetrics & Gynecology

## 2016-03-03 ENCOUNTER — Encounter: Payer: Self-pay | Admitting: Emergency Medicine

## 2016-03-03 NOTE — Telephone Encounter (Signed)
Call to patient directly. Please see telephone encounter dated 01/22/16.

## 2016-03-03 NOTE — Telephone Encounter (Signed)
Patient sent mychart messages:   ===View-only below this line===   ----- Message -----    From: Larwance Sachs    Sent: 03/02/2016 12:31 PM EDT      To: Lyman Speller, MD Subject: Visit Follow-Up Question  Would like to get the name of a recommended dermatologist.  Thanks!  ===View-only below this line===   ----- Message -----    From: Larwance Sachs    Sent: 03/02/2016 12:30 PM EDT      To: Lyman Speller, MD Subject: Visit Follow-Up Question  Dr. Sabra Heck - I am following up on the referral to a nutritionist - could your office facilitate that (again?) for me please? Also, I am curious about your recommendations re: the IUD given the bloodwork - and would like your interpretation of where I am per menopause! Thank you!

## 2016-03-03 NOTE — Telephone Encounter (Signed)
Call to patient. She is given messages from Dr. Sabra Heck regarding her [facility] and estradiol level clearly indicative of menopause and recommendations again discussed with patient for pelvic ultrasound and Endometrial biopsy for evaluation of post menopausal bleeding.   Patient states she cannot remember the last time she had any bleeding and that she will call back if necessary if she has any future bleeding. She will discuss IUD with Dr. Sabra Heck at next annual exam.   Referral re-entered for nutritrion, patient will call herself and number given.  Patient given contact information for [facility]   Patient has scheduled mammogram and bone density testing at [facility].    Routing to Dr. Sabra Heck to review and advise regarding PMB recommendations. Needs Letter?

## 2016-03-19 ENCOUNTER — Ambulatory Visit
Admission: RE | Admit: 2016-03-19 | Discharge: 2016-03-19 | Disposition: A | Discharge status: Home / Self Care | Payer: BLUE CROSS/BLUE SHIELD | Source: Ambulatory Visit | Attending: Obstetrics & Gynecology | Admitting: Obstetrics & Gynecology

## 2016-03-19 ENCOUNTER — Ambulatory Visit
Admission: RE | Admit: 2016-03-19 | Discharge: 2016-03-19 | Disposition: A | Discharge status: Home / Self Care | Payer: BLUE CROSS/BLUE SHIELD | Source: Ambulatory Visit | Attending: Family Medicine | Admitting: Family Medicine

## 2016-03-19 DIAGNOSIS — E2839 Other primary ovarian failure: Secondary | ICD-10-CM

## 2016-03-19 DIAGNOSIS — Z1231 Encounter for screening mammogram for malignant neoplasm of breast: Secondary | ICD-10-CM

## 2016-03-24 ENCOUNTER — Other Ambulatory Visit: Payer: Self-pay | Admitting: Family Medicine

## 2016-03-24 DIAGNOSIS — R928 Other abnormal and inconclusive findings on diagnostic imaging of breast: Secondary | ICD-10-CM

## 2016-03-31 NOTE — Telephone Encounter (Signed)
Noted message from Dr. Miller and will close encounter.   

## 2016-03-31 NOTE — Telephone Encounter (Signed)
She is clearly aware of recommendations so I do not think a letter is necessary.  Ok to close encounter.

## 2016-04-13 ENCOUNTER — Telehealth: Payer: Self-pay | Admitting: *Deleted

## 2016-04-13 NOTE — Telephone Encounter (Signed)
Called patient. Unable to leave voicemail.  BMD results sent thru My Chart

## 2016-04-19 ENCOUNTER — Encounter: Payer: Self-pay | Admitting: Family Medicine

## 2016-04-21 ENCOUNTER — Ambulatory Visit
Admission: RE | Admit: 2016-04-21 | Discharge: 2016-04-21 | Disposition: A | Discharge status: Home / Self Care | Payer: BLUE CROSS/BLUE SHIELD | Source: Ambulatory Visit | Attending: Family Medicine | Admitting: Family Medicine

## 2016-04-21 DIAGNOSIS — R928 Other abnormal and inconclusive findings on diagnostic imaging of breast: Secondary | ICD-10-CM

## 2016-04-22 NOTE — Telephone Encounter (Signed)
Please sent letter.  Thanks.

## 2016-04-22 NOTE — Telephone Encounter (Signed)
Called patient. Second attempt. Unable to leave message, voicemail full. Re: BMD results sent thru MyChart  Can we send a letter?

## 2016-04-28 ENCOUNTER — Encounter: Payer: Self-pay | Admitting: Obstetrics & Gynecology

## 2016-04-28 NOTE — Telephone Encounter (Signed)
Routed message to patient via mychart with response.  Will route to Dr. Sabra Heck and close.

## 2016-04-29 ENCOUNTER — Telehealth: Payer: Self-pay | Admitting: Emergency Medicine

## 2016-04-29 NOTE — Telephone Encounter (Signed)
Out of hold per Dr. Sabra Heck.

## 2016-04-29 NOTE — Telephone Encounter (Signed)
Patient sent mychart message and response was returned to her with normal results.   Call to patient and detailed message left via cell phone with normal results and message from Dr. Sabra Heck.  Detailed message okay per designated party release form.  Will close encounter.

## 2016-04-29 NOTE — Telephone Encounter (Signed)
-----   Message from Megan Salon, MD sent at 04/27/2016 10:06 AM EDT ----- Regarding: RE: Mammogram hold  Yes, out of MMG hold.  Thanks.  MSM ----- Message -----    From: Michele Mcalpine, RN    Sent: 04/26/2016   3:37 PM      To: Megan Salon, MD Subject: Mammogram hold                                 Dr. Sabra Heck,  Patient in mammogram hold for diagnostic imaging.  Imaging completed and results sent to PCP. Okay to remove from hold?

## 2016-05-03 ENCOUNTER — Encounter: Payer: Self-pay | Admitting: Dietician

## 2016-05-03 ENCOUNTER — Encounter: Payer: BLUE CROSS/BLUE SHIELD | Attending: Obstetrics & Gynecology | Admitting: Dietician

## 2016-05-03 VITALS — Ht 67.0 in | Wt 168.0 lb

## 2016-05-03 DIAGNOSIS — R635 Abnormal weight gain: Secondary | ICD-10-CM | POA: Insufficient documentation

## 2016-05-03 DIAGNOSIS — E663 Overweight: Secondary | ICD-10-CM

## 2016-05-03 NOTE — Progress Notes (Signed)
  Medical Nutrition Therapy:  Appt start time: 9983 end time:  3825.   Assessment:  Primary concerns today: Patient is here alone.  She is here due to recent weight gain.  Hx includes:  Ulcerative colitis which she states is better controlled recently, history of vitamin D deficiency.  Vitamin D 26 11/01/13.  She states that she was "super fit" in her early 51's and was going to [employer].  She weighed 135 lbs at that time.  She has gained weight in the last 10 years up to today's weight of 168 lbs.   She states that she continues to feel great and is very active but does not like the number on the scale. She is postmenopausal.  Patient lives alone.  Son is currently home for the summer.  She is control of her own food choices.  She works in Architect at [employer].  She has a master's degree in counseling.  "[employer] fanatic years ago."  Preferred Learning Style:   No preference indicated   Learning Readiness:   Ready  Change in progress   MEDICATIONS: see list to include Vitamin D, MVI, and probiotic   DIETARY INTAKE:  Usual eating pattern includes 2-3 meals and 1 snacks per day.  Everyday foods include a lot of vegetables, lean meat.  Avoided foods include fruit (only eats clementines and apples occaionally.    24-hr recall:  B ( AM): coffee with half and half OR Almond milk and often skips breakfast OR smoothie (whey protein, banana, water, and ice)  OR yogurt and granola Snk ( AM): none  L ( PM): Brings lunch:  Roasted veges, salmon or chicken OR out to eat:  Chick fil-A or Taco Snk ( PM): none D ( PM): Salmon and chicken, kale and roasted tomateos, carbs OR chips and salsa and pistachios Snk ( PM): chips and salsa or cheese and crackers or pistachios or carrots and humus or clementine  "as a reward" Beverages: water, selter, coffee with half and half or almond milk, occasional hot tea, or half and half tea when out to eat.  Rare alcohol (1 every  week or two socially).  Usual physical activity: yoga twice per week, body pump twice per week, tennis 1-2 times per week, run or walk 1-2 times per week, ride horses once per week.    Estimated energy needs: 1600 calories 180 g carbohydrates 120 g protein 44 g fat  Progress Towards Goal(s):  In progress.   Nutritional Diagnosis:  NB-1.1 Food and nutrition-related knowledge deficit As related to balance of energy intake and expenditure.  As evidenced by weight gain.    Intervention:  Nutrition counseling/education related to mindful eating for weight management.  Eat slowly.  Stop when you are full.  Listen to your body. Avoid food as your reward.  What can you reward yourself with instead. Consider an afternoon snack and a lighter dinner. Don't skip breakfast. Keep an active lifestyle. Consider journal ling. Continue your vitamin D. Consider reading "Intuitive Eating" by Ernst Spell, MS, RD and Sidney Ace, MS, RD  Teaching Method Utilized:  Visual Auditory Hands on  Handouts given during visit include: Barriers to learning/adherence to lifestyle change: none Demonstrated degree of understanding via:  Teach Back  Monitoring/Evaluation:  Dietary intake, exercise, and body weight prn.

## 2016-05-03 NOTE — Patient Instructions (Addendum)
Eat slowly.  Stop when you are full.  Listen to your body. Avoid food as your reward.  What can you reward yourself with instead. Consider an afternoon snack and a lighter dinner. Don't skip breakfast. Keep an active lifestyle. Consider journal ling. Continue your vitamin D. Consider reading "Intuitive Eating" by Ernst Spell, MS, RD and Sidney Ace, MS, RD

## 2016-05-11 ENCOUNTER — Encounter: Payer: Self-pay | Admitting: Family Medicine

## 2016-05-12 ENCOUNTER — Encounter: Payer: Self-pay | Admitting: Family Medicine

## 2016-08-05 ENCOUNTER — Ambulatory Visit: Payer: BLUE CROSS/BLUE SHIELD | Admitting: Family Medicine

## 2016-09-16 ENCOUNTER — Ambulatory Visit (INDEPENDENT_AMBULATORY_CARE_PROVIDER_SITE_OTHER): Payer: BLUE CROSS/BLUE SHIELD | Admitting: Sports Medicine

## 2016-10-18 ENCOUNTER — Other Ambulatory Visit (INDEPENDENT_AMBULATORY_CARE_PROVIDER_SITE_OTHER): Payer: Self-pay | Admitting: Sports Medicine

## 2016-10-18 MED ORDER — DICLOFENAC SODIUM 2 % TD SOLN: 1.0000 "application " | Freq: Two times a day (BID) | TRANSDERMAL | 2 refills | Status: DC

## 2016-10-18 NOTE — Progress Notes (Signed)
Rx called in from last OV in [facility]

## 2018-05-23 ENCOUNTER — Other Ambulatory Visit: Payer: Self-pay | Admitting: Family Medicine

## 2018-05-23 DIAGNOSIS — Z1231 Encounter for screening mammogram for malignant neoplasm of breast: Secondary | ICD-10-CM

## 2018-05-26 ENCOUNTER — Telehealth: Payer: Self-pay | Admitting: Obstetrics & Gynecology

## 2018-05-26 NOTE — Telephone Encounter (Signed)
Patient has some questions about when her next pap smear is due? Patient thinks she is due every five years and her last pap was January 2015.

## 2018-05-29 NOTE — Telephone Encounter (Signed)
Attempted to reach patient at number provided [phone]. There was no answer and voicemail box is full. Last pap in 2015 was normal. Needs pap at this time.

## 2018-06-01 NOTE — Telephone Encounter (Signed)
Spoke with patient. Advised patient she is due for a pap smear at her next aex as her last pap was performed in 2015. Patient is scheduled for aex in July 2019 with Dr.Miller.  Routing to provider for final review. Patient agreeable to disposition. Will close encounter.

## 2018-06-01 NOTE — Telephone Encounter (Signed)
Attempted to reach patient at number provided [phone]. There was no answer and voicemail box is full.

## 2018-06-20 ENCOUNTER — Ambulatory Visit
Admission: RE | Admit: 2018-06-20 | Discharge: 2018-06-20 | Disposition: A | Discharge status: Home / Self Care | Payer: BLUE CROSS/BLUE SHIELD | Source: Ambulatory Visit | Attending: Family Medicine | Admitting: Family Medicine

## 2018-06-20 DIAGNOSIS — Z1231 Encounter for screening mammogram for malignant neoplasm of breast: Secondary | ICD-10-CM

## 2018-07-04 ENCOUNTER — Ambulatory Visit: Payer: Self-pay | Admitting: Obstetrics & Gynecology

## 2018-07-04 ENCOUNTER — Encounter: Payer: Self-pay | Admitting: Obstetrics & Gynecology

## 2018-07-04 NOTE — Progress Notes (Deleted)
62 y.o. G3P2 DivorcedCaucasianF here for annual exam.    Patient's last menstrual period was 11/27/2015.          Sexually active: {yes no:314532}  The current method of family planning is {contraception:315051}.    Exercising: {yes no:314532}  {types:19826} Smoker:  {YES P5382123  Health Maintenance: Pap:  11/26/14 Neg  History of abnormal Pap:  no MMG:  06/20/18 BIRADS1:Neg  Colonoscopy:  03/19/16 Normal.  BMD:   *** TDaP:  2014 Pneumonia vaccine(s):  *** Shingrix:   *** Hep C testing: 01/16/16 Neg  Screening Labs: ***, Hb today: ***, Urine today: ***   reports that she has never smoked. She has never used smokeless tobacco. She reports that she drinks alcohol. She reports that she does not use drugs.  Past Medical History:  Diagnosis Date  . Kidney stones   . Ulcerative colitis ([place])   . Vitamin D deficiency     No past surgical history on file.  Current Outpatient Medications  Medication Sig Dispense Refill  . calcium citrate-vitamin D (CITRACAL+D) 315-200 MG-UNIT tablet Take 1 tablet by mouth daily.    . cholecalciferol (VITAMIN D) 1000 units tablet Take 1,000 Units by mouth daily.    . Diclofenac Sodium (PENNSAID) 2 % SOLN Place 1 application onto the skin 2 (two) times daily. 112 g 2  . glucosamine-chondroitin 500-400 MG tablet Take 1 tablet by mouth 3 (three) times daily.    Marland Kitchen lactobacillus acidophilus (BACID) TABS tablet Take 2 tablets by mouth 3 (three) times daily.    Marland Kitchen levonorgestrel (MIRENA) 20 MCG/24HR IUD 1 each by Intrauterine route once.    . mesalamine (CANASA) 1000 MG suppository Place 1,000 mg rectally at bedtime.    . mesalamine (LIALDA) 1.2 G EC tablet Take 1,200 mg by mouth daily with breakfast.    . Multiple Vitamin (MULTIVITAMIN WITH MINERALS) TABS tablet Take 1 tablet by mouth daily.    . mupirocin ointment (BACTROBAN) 2 % Apply 1 application topically 3 (three) times daily. To areas of intranasal irritation 30 g 1   No current facility-administered  medications for this visit.     Family History  Problem Relation Age of Onset  . Alzheimer's disease Mother   . Anemia Mother   . Heart murmur Father     ROS  Exam:   LMP 11/27/2015   Height:      Ht Readings from Last 3 Encounters:  05/03/16 5' 7"  (1.702 m)  01/16/16 5' 7"  (1.702 m)  01/15/16 5' 7"  (1.702 m)    General appearance: alert, cooperative and appears stated age Head: Normocephalic, without obvious abnormality, atraumatic Neck: no adenopathy, supple, symmetrical, trachea midline and thyroid {EXAM; THYROID:18604} Lungs: clear to auscultation bilaterally Breasts: {Exam; breast:13139::"normal appearance, no masses or tenderness"} Heart: regular rate and rhythm Abdomen: soft, non-tender; bowel sounds normal; no masses,  no organomegaly Extremities: extremities normal, atraumatic, no cyanosis or edema Skin: Skin color, texture, turgor normal. No rashes or lesions Lymph nodes: Cervical, supraclavicular, and axillary nodes normal. No abnormal inguinal nodes palpated Neurologic: Grossly normal   Pelvic: External genitalia:  no lesions              Urethra:  normal appearing urethra with no masses, tenderness or lesions              Bartholins and Skenes: normal                 Vagina: normal appearing vagina with normal color and discharge, no lesions  Cervix: {exam; cervix:14595}              Pap taken: {yes no:314532} Bimanual Exam:  Uterus:  {exam; uterus:12215}              Adnexa: {exam; adnexa:12223}               Rectovaginal: Confirms               Anus:  normal sphincter tone, no lesions  Chaperone was present for exam.  A:  Well Woman with normal exam  P:   {plan; gyn:5269::"mammogram","pap smear","return annually or prn"}

## 2018-10-17 ENCOUNTER — Encounter: Payer: Self-pay | Admitting: Obstetrics & Gynecology

## 2018-10-17 ENCOUNTER — Ambulatory Visit: Payer: BLUE CROSS/BLUE SHIELD | Admitting: Obstetrics & Gynecology

## 2018-10-17 ENCOUNTER — Other Ambulatory Visit: Payer: Self-pay

## 2018-10-17 VITALS — BP 130/80 | HR 80 | Resp 16 | Ht 67.0 in | Wt 167.8 lb

## 2018-10-17 DIAGNOSIS — Z124 Encounter for screening for malignant neoplasm of cervix: Secondary | ICD-10-CM | POA: Diagnosis not present

## 2018-10-17 DIAGNOSIS — Z01419 Encounter for gynecological examination (general) (routine) without abnormal findings: Secondary | ICD-10-CM | POA: Diagnosis not present

## 2018-10-17 DIAGNOSIS — Z30432 Encounter for removal of intrauterine contraceptive device: Secondary | ICD-10-CM | POA: Diagnosis not present

## 2018-10-17 NOTE — Progress Notes (Signed)
62 y.o. G3P2 Divorced White or Caucasian female here for annual exam.  Doing well.  IUD removal due today.  Denies vaginal bleeding.    Experiencing some issues with joints.  Pain and inflammation seems to move between joints.  H/o U.C.  Aware colonoscopy is due.  Has not gone for this yet.  PCP:  Dr. Darron Doom  Patient's last menstrual period was 11/27/2015.          Sexually active: No.  The current method of family planning is abstinence.    Exercising: Yes.    tennis, yoga, weights, run Smoker:  no  Health Maintenance: Pap:  11/26/14 Neg  History of abnormal Pap:  no MMG:  06/20/18 BIRADS1:Neg  Colonoscopy:  2013 Dr. Collene Mares.  Follow up five years. BMD:   03/19/16 Normal TDaP:  2014 Pneumonia vaccine(s):  n/a Shingrix:   Completed  Hep C testing: 01/16/16 Neg  Screening Labs: PCP   reports that she has never smoked. She has never used smokeless tobacco. She reports that she drinks about 1.0 standard drinks of alcohol per week. She reports that she does not use drugs.  Past Medical History:  Diagnosis Date  . Kidney stones   . Ulcerative colitis ([place])   . Vitamin D deficiency     History reviewed. No pertinent surgical history.  Current Outpatient Medications  Medication Sig Dispense Refill  . cholecalciferol (VITAMIN D) 1000 units tablet Take 1,000 Units by mouth daily.    Marland Kitchen levonorgestrel (MIRENA) 20 MCG/24HR IUD 1 each by Intrauterine route once.    . magnesium 30 MG tablet Take 30 mg by mouth daily.    . mesalamine (LIALDA) 1.2 G EC tablet Take 1,200 mg by mouth daily with breakfast.    . mupirocin ointment (BACTROBAN) 2 % Apply 1 application topically 3 (three) times daily. To areas of intranasal irritation 30 g 1   No current facility-administered medications for this visit.     Family History  Problem Relation Age of Onset  . Alzheimer's disease Mother   . Anemia Mother   . Heart murmur Father     Review of Systems  Gastrointestinal: Positive for abdominal  distention.  All other systems reviewed and are negative.   Exam:   BP 130/80 (BP Location: Right Arm, Patient Position: Sitting, Cuff Size: Large)   Pulse 80   Resp 16   Ht 5' 7"  (1.702 m)   Wt 167 lb 12.8 oz (76.1 kg)   LMP 11/27/2015   BMI 26.28 kg/m   Height: 5' 7"  (170.2 cm)  Ht Readings from Last 3 Encounters:  10/17/18 5' 7"  (1.702 m)  05/03/16 5' 7"  (1.702 m)  01/16/16 5' 7"  (1.702 m)    General appearance: alert, cooperative and appears stated age Head: Normocephalic, without obvious abnormality, atraumatic Neck: no adenopathy, supple, symmetrical, trachea midline and thyroid normal to inspection and palpation Lungs: clear to auscultation bilaterally Breasts: normal appearance, no masses or tenderness Heart: regular rate and rhythm Abdomen: soft, non-tender; bowel sounds normal; no masses,  no organomegaly Extremities: extremities normal, atraumatic, no cyanosis or edema Skin: Skin color, texture, turgor normal. No rashes or lesions Lymph nodes: Cervical, supraclavicular, and axillary nodes normal. No abnormal inguinal nodes palpated Neurologic: Grossly normal   Pelvic: External genitalia:  no lesions              Urethra:  normal appearing urethra with no masses, tenderness or lesions  Bartholins and Skenes: normal                 Vagina: normal appearing vagina with normal color and discharge, no lesions              Cervix: no lesions              Pap taken: Yes.   Bimanual Exam:  Uterus:  normal size, contour, position, consistency, mobility, non-tender              Adnexa: normal adnexa and no mass, fullness, tenderness               Rectovaginal: Confirms               Anus:  normal sphincter tone, no lesions  Procedure:  Cervix visulaized and IUD string noted.  IUD string grasped with ring forceps and removed with one pull.  Pt tolerated procedure well.  IUD discarded after being visualized by pt.  Chaperone was present for exam.  A:  Well  Woman with normal exam PMP, no HRT H/O ulcerative colitis.  Followed by Dr. Collene Mares.   PMP with elevated FSH last year  P:   Mammogram guidelines reviewed.  Has hx of breast calcifications Pap smear with HR HPV obtained today IUD removal done today Lab work and vaccines are UTD Return annually or prn

## 2018-10-19 LAB — CYTOLOGY - PAP
Diagnosis: NEGATIVE
HPV: NOT DETECTED

## 2020-02-04 ENCOUNTER — Other Ambulatory Visit: Payer: Self-pay

## 2020-02-05 ENCOUNTER — Encounter: Payer: Self-pay | Admitting: Obstetrics & Gynecology

## 2020-02-05 ENCOUNTER — Ambulatory Visit: Payer: BC Managed Care – PPO | Admitting: Obstetrics & Gynecology

## 2020-02-05 VITALS — BP 122/70 | HR 80 | Temp 97.0°F | Resp 10 | Ht 66.75 in | Wt 174.0 lb

## 2020-02-05 DIAGNOSIS — M25549 Pain in joints of unspecified hand: Secondary | ICD-10-CM | POA: Diagnosis not present

## 2020-02-05 DIAGNOSIS — M25511 Pain in right shoulder: Secondary | ICD-10-CM | POA: Diagnosis not present

## 2020-02-05 DIAGNOSIS — M25512 Pain in left shoulder: Secondary | ICD-10-CM

## 2020-02-05 DIAGNOSIS — Z01419 Encounter for gynecological examination (general) (routine) without abnormal findings: Secondary | ICD-10-CM

## 2020-02-05 NOTE — Progress Notes (Signed)
64 y.o. G3P2 Divorced White or Caucasian female here for annual exam.  Denies vaginal bleeding.  Broke her arm, left, after falling off a horse.  Was in a sling for a few weeks.  She did some physical therapy.    Having intermittent pain in multiple joints, that can be significant, but does not typically last very long.  She does have swelling in the joints when this occurs.  Seems to be confined to her thumbs and shoulders.  Pain can be very severe.  This has been ongoing for several years.  PCP:  Dr. Darron Doom.  She had some blood work in 2019 for autoimmune d/o.  This was all negative.   Patient's last menstrual period was 11/27/2015.          Sexually active: No.  The current method of family planning is abstinence.    Exercising: Yes.    running, walking, yoga, and weigths Smoker:  no  Health Maintenance: Pap:   10/17/18 Neg:Neg HR HPV  11/26/14 Neg  History of abnormal Pap:  no MMG:  06/20/18 BIRADS 1 negative/density b Colonoscopy:  02/23/2019 -- f/u 5 years, Dr. Collene Mares BMD:   03/19/16 Normal TDaP:  04/20/2013 Pneumonia vaccine(s):  n/a Shingrix:   Completed  Hep C testing: 01/16/16 Neg Screening Labs: does with Dr. Darron Doom   reports that she has never smoked. She has never used smokeless tobacco. She reports current alcohol use of about 1.0 standard drinks of alcohol per week. She reports that she does not use drugs.  Past Medical History:  Diagnosis Date  . Kidney stones   . Ulcerative colitis ([place])   . Vitamin D deficiency     History reviewed. No pertinent surgical history.  Current Outpatient Medications  Medication Sig Dispense Refill  . cholecalciferol (VITAMIN D) 1000 units tablet Take 1,000 Units by mouth daily.    . magnesium 30 MG tablet Take 30 mg by mouth daily.    . mesalamine (LIALDA) 1.2 G EC tablet Take 1,200 mg by mouth daily with breakfast.    . Omega-3 Fatty Acids (FISH OIL) 1000 MG CAPS      No current facility-administered medications for this visit.     Family History  Problem Relation Age of Onset  . Alzheimer's disease Mother   . Anemia Mother   . Heart murmur Father     Review of Systems  All other systems reviewed and are negative.   Exam:   BP 122/70 (BP Location: Right Arm, Patient Position: Sitting, Cuff Size: Normal)   Pulse 80   Temp (!) 97 F (36.1 C) (Temporal)   Resp 10   Ht 5' 6.75" (1.695 m)   Wt 174 lb (78.9 kg)   LMP 11/27/2015   BMI 27.46 kg/m     Height: 5' 6.75" (169.5 cm)  Ht Readings from Last 3 Encounters:  02/05/20 5' 6.75" (1.695 m)  10/17/18 5' 7"  (1.702 m)  05/03/16 5' 7"  (1.702 m)    General appearance: alert, cooperative and appears stated age Head: Normocephalic, without obvious abnormality, atraumatic Neck: no adenopathy, supple, symmetrical, trachea midline and thyroid normal to inspection and palpation Lungs: clear to auscultation bilaterally Breasts: normal appearance, no masses or tenderness Heart: regular rate and rhythm Abdomen: soft, non-tender; bowel sounds normal; no masses,  no organomegaly Extremities: extremities normal, atraumatic, no cyanosis or edema Skin: Skin color, texture, turgor normal. No rashes or lesions Lymph nodes: Cervical, supraclavicular, and axillary nodes normal. No abnormal inguinal nodes palpated Neurologic: Grossly  normal   Pelvic: External genitalia:  no lesions              Urethra:  normal appearing urethra with no masses, tenderness or lesions              Bartholins and Skenes: normal                 Vagina: normal appearing vagina with normal color and discharge, no lesions              Cervix: no lesions              Pap taken: No. Bimanual Exam:  Uterus:  normal size, contour, position, consistency, mobility, non-tender              Adnexa: normal adnexa and no mass, fullness, tenderness               Rectovaginal: Confirms               Anus:  normal sphincter tone, no lesions  Chaperone, Terence Lux, CMA, was present for  exam.  A:  Well Woman with normal exam PMP, no HRT Multiple joint pain H/o ulcerative colitis, followed by Dr. Collene Mares H/o breast calcificatoins  P:   Mammogram guidelines reviewed.  She is aware this is due.   pap smear with neg HR HPV 11/19.   Lab work done with Dr. Darron Doom Referral to Dr Estanislado Pandy return annually or prn

## 2020-04-10 NOTE — Progress Notes (Signed)
Office Visit Note  Patient: Mia Jordan             Date of Birth: 17-Feb-1956           MRN: [id number]             PCP: Hayden Rasmussen, MD Referring: Megan Salon, MD Visit Date: 04/16/2020 Occupation: @GUAROCC @  Subjective:  Pain in both hands and shoulders.   History of Present Illness: Mia Jordan is a 64 y.o. female seen in consultation per request of Dr. Sabra Heck.  According to patient she was diagnosed with ulcerative colitis 9 years ago.  She has been on Lialda by Dr. Collene Mares since then.  She states she is not very compliant with the medication.  She has occasional flares with bloating.  She states the flares happens about once in 2 months.  Her last flare was last weekend.  She has not had blood in her stool or diarrhea in a long time.  She states for the last 4 years she has been experiencing pain and stiffness in her hands.  The pain is mostly in the base of her thumbs.  It usually gets worse at the end of the day and gets better in the morning.  She is also noticed some stiffness in her hands.  She has occasional discomfort in her shoulders.  She takes Tylenol on as needed basis.  She has not noticed any joint swelling.  None of the other joints are painful.  There is no history of psoriasis.  There is no family history of inflammatory arthritis. Activities of Daily Living:  Patient reports morning stiffness for 0 minutes.   Patient Reports nocturnal pain.  Difficulty dressing/grooming: Reports Difficulty climbing stairs: Denies Difficulty getting out of chair: Denies Difficulty using hands for taps, buttons, cutlery, and/or writing: Reports  Review of Systems  Constitutional: Negative for fatigue, night sweats, weight gain and weight loss.  HENT: Negative for mouth sores, trouble swallowing, trouble swallowing, mouth dryness and nose dryness.   Eyes: Negative for pain, redness, itching, visual disturbance and dryness.  Respiratory: Negative for cough, shortness  of breath and difficulty breathing.   Cardiovascular: Negative for chest pain, palpitations, hypertension, irregular heartbeat and swelling in legs/feet.  Gastrointestinal: Negative for blood in stool, constipation and diarrhea.  Endocrine: Negative for increased urination.  Genitourinary: Negative for difficulty urinating and vaginal dryness.  Musculoskeletal: Positive for arthralgias and joint pain. Negative for joint swelling, myalgias, muscle weakness, morning stiffness, muscle tenderness and myalgias.  Skin: Negative for color change, rash, hair loss, redness, skin tightness, ulcers and sensitivity to sunlight.  Allergic/Immunologic: Negative for susceptible to infections.  Neurological: Negative for dizziness, numbness, headaches, memory loss, night sweats and weakness.  Hematological: Negative for bruising/bleeding tendency and swollen glands.  Psychiatric/Behavioral: Negative for depressed mood, confusion and sleep disturbance. The patient is not nervous/anxious.     PMFS History:  Patient Active Problem List   Diagnosis Date Noted  . Nephrolithiasis 04/20/2013  . Ulcerative colitis ([place]) 04/20/2013  . HEADACHE 08/22/2007    Past Medical History:  Diagnosis Date  . Kidney stones   . Ulcerative colitis ([place])   . Vitamin D deficiency     Family History  Problem Relation Age of Onset  . Alzheimer's disease Mother   . Anemia Mother   . Heart murmur Father   . Arrhythmia Father   . Healthy Son   . Healthy Daughter    History reviewed. No pertinent surgical history.  Social History   Social History Narrative  . Not on file   Immunization History  Administered Date(s) Administered  . Hepatitis A 04/20/2013  . Hepatitis A, Adult 01/15/2016  . Hepatitis B 04/20/2013  . Hepatitis B, adult 08/31/2013, 01/15/2016  . Influenza,inj,Quad PF,6+ Mos 08/31/2013  . Tdap 04/20/2013     Objective: Vital Signs: BP 115/77 (BP Location: Right Arm, Patient Position: Sitting, Cuff  Size: Normal)   Pulse 70   Resp 15   Ht 5' 7"  (1.702 m)   Wt 178 lb 3.2 oz (80.8 kg)   LMP 11/27/2015   BMI 27.91 kg/m    Physical Exam Vitals and nursing note reviewed.  Constitutional:      Appearance: She is well-developed.  HENT:     Head: Normocephalic and atraumatic.  Eyes:     Conjunctiva/sclera: Conjunctivae normal.  Cardiovascular:     Rate and Rhythm: Normal rate and regular rhythm.     Heart sounds: Normal heart sounds.  Pulmonary:     Effort: Pulmonary effort is normal.     Breath sounds: Normal breath sounds.  Abdominal:     General: Bowel sounds are normal.     Palpations: Abdomen is soft.  Musculoskeletal:     Cervical back: Normal range of motion.  Lymphadenopathy:     Cervical: No cervical adenopathy.  Skin:    General: Skin is warm and dry.     Capillary Refill: Capillary refill takes less than 2 seconds.  Neurological:     Mental Status: She is alert and oriented to person, place, and time.  Psychiatric:        Behavior: Behavior normal.      Musculoskeletal Exam: C-spine thoracic and lumbar spine were in good range of motion.  She had no SI joint tenderness.  Shoulder joints, elbow joints, wrist joints, MCPs and PIPs and DIPs been good range of motion.  She has bilateral CMC PIP and DIP thickening with no synovitis.  No nail pitting was noted.  Hip joints, knee joints, ankles, MTPs and PIPs with good range of motion with no synovitis.  CDAI Exam: CDAI Score: -- Patient Global: --; Provider Global: -- Swollen: --; Tender: -- Joint Exam 04/16/2020   No joint exam has been documented for this visit   There is currently no information documented on the homunculus. Go to the Rheumatology activity and complete the homunculus joint exam.  Investigation: No additional findings.  Imaging: No results found.  Recent Labs: Lab Results  Component Value Date   WBC 7.1 04/07/2013   HGB 13.1 11/01/2013   NA 138 11/01/2013   K 4.4 11/01/2013   CL  106 11/01/2013   CO2 25 11/01/2013   GLUCOSE 111 (H) 11/01/2013   BUN 23 11/01/2013   CREATININE 0.70 11/01/2013   BILITOT 0.6 11/01/2013   ALKPHOS 69 12/25/2015   AST 20 12/25/2015   ALT 89 (H) 11/01/2013   PROT 6.7 11/01/2013   ALBUMIN 4.0 11/01/2013   CALCIUM 9.0 11/01/2013    Speciality Comments: No specialty comments available.  Procedures:  No procedures performed Allergies: Patient has no known allergies.   Assessment / Plan:     Visit Diagnoses: Pain in both hands -patient complains of pain and discomfort in her bilateral hands.  No synovitis was noted.  She has prominence of PIP and DIP joints.  She also has bilateral CMC prominence and tenderness.  She is on the computer at work and she plays tennis.  She has extra  strain on her right [facility] joint.  Detailed counseling guarding osteoarthritis was provided.  Natural anti-inflammatories were discussed.  Joint protection muscle strengthening was discussed.  I have given her prescription for right CMC brace which will be useful.  I will obtain labs to complete the work-up although I do not see any inflammation.  Plan: Sedimentation rate, Rheumatoid factor, Cyclic citrul peptide antibody, IgG, 14-3-3 eta Protein, Uric acid, XR Hand 2 View Right, XR Hand 2 View Left.  X-ray findings were consistent with osteoarthritis.  Chronic pain of both shoulders-patient complains of intermittent discomfort in her shoulders.  Which may or may not be related to the underlying process.  She plays tennis and practices yoga which could be putting extra stress on her shoulders.  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  She has mild contracture in her left elbow.  She has been trying to do stretches.  Ulcerative colitis without complications, unspecified location (HCC)-she is on Lialda.  She has been followed by Dr. Collene Mares.  She states she is not very compliant with her medications.  Need for taking medications on a  regular basis was emphasized.  Nephrolithiasis-she has had renal stones in the past which has not been analyzed.  Vitamin D deficiency-she states she has been taking vitamin D and now her vitamin D levels are normal.  Orders: Orders Placed This Encounter  Procedures  . XR Hand 2 View Right  . XR Hand 2 View Left  . Sedimentation rate  . Rheumatoid factor  . Cyclic citrul peptide antibody, IgG  . 14-3-3 eta Protein  . Uric acid   No orders of the defined types were placed in this encounter.   Face-to-face time spent with patient was 45 minutes. Greater than 50% of time was spent in counseling and coordination of care.  Follow-Up Instructions: Return for Osteoarthritis, UC.   Bo Merino, MD  Note - This record has been created using Editor, commissioning.  Chart creation errors have been sought, but may not always  have been located. Such creation errors do not reflect on  the standard of medical care.

## 2020-04-16 ENCOUNTER — Ambulatory Visit: Payer: Self-pay

## 2020-04-16 ENCOUNTER — Other Ambulatory Visit: Payer: Self-pay

## 2020-04-16 ENCOUNTER — Ambulatory Visit: Payer: BLUE CROSS/BLUE SHIELD | Admitting: Rheumatology

## 2020-04-16 ENCOUNTER — Encounter: Payer: Self-pay | Admitting: Rheumatology

## 2020-04-16 VITALS — BP 115/77 | HR 70 | Resp 15 | Ht 67.0 in | Wt 178.2 lb

## 2020-04-16 DIAGNOSIS — N200 Calculus of kidney: Secondary | ICD-10-CM | POA: Diagnosis not present

## 2020-04-16 DIAGNOSIS — M79641 Pain in right hand: Secondary | ICD-10-CM

## 2020-04-16 DIAGNOSIS — K5190 Ulcerative colitis, unspecified, without complications: Secondary | ICD-10-CM | POA: Diagnosis not present

## 2020-04-16 DIAGNOSIS — M79642 Pain in left hand: Secondary | ICD-10-CM | POA: Diagnosis not present

## 2020-04-16 DIAGNOSIS — M25512 Pain in left shoulder: Secondary | ICD-10-CM

## 2020-04-16 DIAGNOSIS — M24522 Contracture, left elbow: Secondary | ICD-10-CM

## 2020-04-16 DIAGNOSIS — E559 Vitamin D deficiency, unspecified: Secondary | ICD-10-CM

## 2020-04-16 DIAGNOSIS — M25549 Pain in joints of unspecified hand: Secondary | ICD-10-CM

## 2020-04-16 DIAGNOSIS — M25511 Pain in right shoulder: Secondary | ICD-10-CM | POA: Diagnosis not present

## 2020-04-16 DIAGNOSIS — G8929 Other chronic pain: Secondary | ICD-10-CM

## 2020-04-16 NOTE — Patient Instructions (Signed)
Shoulder Exercises Ask your health care provider which exercises are safe for you. Do exercises exactly as told by your health care provider and adjust them as directed. It is normal to feel mild stretching, pulling, tightness, or discomfort as you do these exercises. Stop right away if you feel sudden pain or your pain gets worse. Do not begin these exercises until told by your health care provider. Stretching exercises External rotation and abduction This exercise is sometimes called corner stretch. This exercise rotates your arm outward (external rotation) and moves your arm out from your body (abduction). 1. Stand in a doorway with one of your feet slightly in front of the other. This is called a staggered stance. If you cannot reach your forearms to the door frame, stand facing a corner of a room. 2. Choose one of the following positions as told by your health care provider: ? Place your hands and forearms on the door frame above your head. ? Place your hands and forearms on the door frame at the height of your head. ? Place your hands on the door frame at the height of your elbows. 3. Slowly move your weight onto your front foot until you feel a stretch across your chest and in the front of your shoulders. Keep your head and chest upright and keep your abdominal muscles tight. 4. Hold for __________ seconds. 5. To release the stretch, shift your weight to your back foot. Repeat __________ times. Complete this exercise __________ times a day. Extension, standing 1. Stand and hold a broomstick, a cane, or a similar object behind your back. ? Your hands should be a little wider than shoulder width apart. ? Your palms should face away from your back. 2. Keeping your elbows straight and your shoulder muscles relaxed, move the stick away from your body until you feel a stretch in your shoulders (extension). ? Avoid shrugging your shoulders while you move the stick. Keep your shoulder blades tucked  down toward the middle of your back. 3. Hold for __________ seconds. 4. Slowly return to the starting position. Repeat __________ times. Complete this exercise __________ times a day. Range-of-motion exercises Pendulum  1. Stand near a wall or a surface that you can hold onto for balance. 2. Bend at the waist and let your left / right arm hang straight down. Use your other arm to support you. Keep your back straight and do not lock your knees. 3. Relax your left / right arm and shoulder muscles, and move your hips and your trunk so your left / right arm swings freely. Your arm should swing because of the motion of your body, not because you are using your arm or shoulder muscles. 4. Keep moving your hips and trunk so your arm swings in the following directions, as told by your health care provider: ? Side to side. ? Forward and backward. ? In clockwise and counterclockwise circles. 5. Continue each motion for __________ seconds, or for as long as told by your health care provider. 6. Slowly return to the starting position. Repeat __________ times. Complete this exercise __________ times a day. Shoulder flexion, standing  1. Stand and hold a broomstick, a cane, or a similar object. Place your hands a little more than shoulder width apart on the object. Your left / right hand should be palm up, and your other hand should be palm down. 2. Keep your elbow straight and your shoulder muscles relaxed. Push the stick up with your healthy arm to  raise your left / right arm in front of your body, and then over your head until you feel a stretch in your shoulder (flexion). ? Avoid shrugging your shoulder while you raise your arm. Keep your shoulder blade tucked down toward the middle of your back. 3. Hold for __________ seconds. 4. Slowly return to the starting position. Repeat __________ times. Complete this exercise __________ times a day. Shoulder abduction, standing 1. Stand and hold a broomstick,  a cane, or a similar object. Place your hands a little more than shoulder width apart on the object. Your left / right hand should be palm up, and your other hand should be palm down. 2. Keep your elbow straight and your shoulder muscles relaxed. Push the object across your body toward your left / right side. Raise your left / right arm to the side of your body (abduction) until you feel a stretch in your shoulder. ? Do not raise your arm above shoulder height unless your health care provider tells you to do that. ? If directed, raise your arm over your head. ? Avoid shrugging your shoulder while you raise your arm. Keep your shoulder blade tucked down toward the middle of your back. 3. Hold for __________ seconds. 4. Slowly return to the starting position. Repeat __________ times. Complete this exercise __________ times a day. Internal rotation  1. Place your left / right hand behind your back, palm up. 2. Use your other hand to dangle an exercise band, a towel, or a similar object over your shoulder. Grasp the band with your left / right hand so you are holding on to both ends. 3. Gently pull up on the band until you feel a stretch in the front of your left / right shoulder. The movement of your arm toward the center of your body is called internal rotation. ? Avoid shrugging your shoulder while you raise your arm. Keep your shoulder blade tucked down toward the middle of your back. 4. Hold for __________ seconds. 5. Release the stretch by letting go of the band and lowering your hands. Repeat __________ times. Complete this exercise __________ times a day. Strengthening exercises External rotation  1. Sit in a stable chair without armrests. 2. Secure an exercise band to a stable object at elbow height on your left / right side. 3. Place a soft object, such as a folded towel or a small pillow, between your left / right upper arm and your body to move your elbow about 4 inches (10 cm) away  from your side. 4. Hold the end of the exercise band so it is tight and there is no slack. 5. Keeping your elbow pressed against the soft object, slowly move your forearm out, away from your abdomen (external rotation). Keep your body steady so only your forearm moves. 6. Hold for __________ seconds. 7. Slowly return to the starting position. Repeat __________ times. Complete this exercise __________ times a day. Shoulder abduction  1. Sit in a stable chair without armrests, or stand up. 2. Hold a __________ weight in your left / right hand, or hold an exercise band with both hands. 3. Start with your arms straight down and your left / right palm facing in, toward your body. 4. Slowly lift your left / right hand out to your side (abduction). Do not lift your hand above shoulder height unless your health care provider tells you that this is safe. ? Keep your arms straight. ? Avoid shrugging your shoulder while you  do this movement. Keep your shoulder blade tucked down toward the middle of your back. 5. Hold for __________ seconds. 6. Slowly lower your arm, and return to the starting position. Repeat __________ times. Complete this exercise __________ times a day. Shoulder extension 1. Sit in a stable chair without armrests, or stand up. 2. Secure an exercise band to a stable object in front of you so it is at shoulder height. 3. Hold one end of the exercise band in each hand. Your palms should face each other. 4. Straighten your elbows and lift your hands up to shoulder height. 5. Step back, away from the secured end of the exercise band, until the band is tight and there is no slack. 6. Squeeze your shoulder blades together as you pull your hands down to the sides of your thighs (extension). Stop when your hands are straight down by your sides. Do not let your hands go behind your body. 7. Hold for __________ seconds. 8. Slowly return to the starting position. Repeat __________ times.  Complete this exercise __________ times a day. Shoulder row 1. Sit in a stable chair without armrests, or stand up. 2. Secure an exercise band to a stable object in front of you so it is at waist height. 3. Hold one end of the exercise band in each hand. Position your palms so that your thumbs are facing the ceiling (neutral position). 4. Bend each of your elbows to a 90-degree angle (right angle) and keep your upper arms at your sides. 5. Step back until the band is tight and there is no slack. 6. Slowly pull your elbows back behind you. 7. Hold for __________ seconds. 8. Slowly return to the starting position. Repeat __________ times. Complete this exercise __________ times a day. Shoulder press-ups  1. Sit in a stable chair that has armrests. Sit upright, with your feet flat on the floor. 2. Put your hands on the armrests so your elbows are bent and your fingers are pointing forward. Your hands should be about even with the sides of your body. 3. Push down on the armrests and use your arms to lift yourself off the chair. Straighten your elbows and lift yourself up as much as you comfortably can. ? Move your shoulder blades down, and avoid letting your shoulders move up toward your ears. ? Keep your feet on the ground. As you get stronger, your feet should support less of your body weight as you lift yourself up. 4. Hold for __________ seconds. 5. Slowly lower yourself back into the chair. Repeat __________ times. Complete this exercise __________ times a day. Wall push-ups  1. Stand so you are facing a stable wall. Your feet should be about one arm-length away from the wall. 2. Lean forward and place your palms on the wall at shoulder height. 3. Keep your feet flat on the floor as you bend your elbows and lean forward toward the wall. 4. Hold for __________ seconds. 5. Straighten your elbows to push yourself back to the starting position. Repeat __________ times. Complete this exercise  __________ times a day. This information is not intended to replace advice given to you by your health care provider. Make sure you discuss any questions you have with your health care provider. Document Revised: 03/23/2019 Document Reviewed: 12/29/2018 Elsevier Patient Education  [place]. Hand Exercises Hand exercises can be helpful for almost anyone. These exercises can strengthen the hands, improve flexibility and movement, and increase blood flow to the hands.  These results can make work and daily tasks easier. Hand exercises can be especially helpful for people who have joint pain from arthritis or have nerve damage from overuse (carpal tunnel syndrome). These exercises can also help people who have injured a hand. Exercises Most of these hand exercises are gentle stretching and motion exercises. It is usually safe to do them often throughout the day. Warming up your hands before exercise may help to reduce stiffness. You can do this with gentle massage or by placing your hands in warm water for 10-15 minutes. It is normal to feel some stretching, pulling, tightness, or mild discomfort as you begin new exercises. This will gradually improve. Stop an exercise right away if you feel sudden, severe pain or your pain gets worse. Ask your health care provider which exercises are best for you. Knuckle bend or "claw" fist 1. Stand or sit with your arm, hand, and all five fingers pointed straight up. Make sure to keep your wrist straight during the exercise. 2. Gently bend your fingers down toward your palm until the tips of your fingers are touching the top of your palm. Keep your big knuckle straight and just bend the small knuckles in your fingers. 3. Hold this position for __________ seconds. 4. Straighten (extend) your fingers back to the starting position. Repeat this exercise 5-10 times with each hand. Full finger fist 1. Stand or sit with your arm, hand, and all five fingers pointed  straight up. Make sure to keep your wrist straight during the exercise. 2. Gently bend your fingers into your palm until the tips of your fingers are touching the middle of your palm. 3. Hold this position for __________ seconds. 4. Extend your fingers back to the starting position, stretching every joint fully. Repeat this exercise 5-10 times with each hand. Straight fist 1. Stand or sit with your arm, hand, and all five fingers pointed straight up. Make sure to keep your wrist straight during the exercise. 2. Gently bend your fingers at the big knuckle, where your fingers meet your hand, and the middle knuckle. Keep the knuckle at the tips of your fingers straight and try to touch the bottom of your palm. 3. Hold this position for __________ seconds. 4. Extend your fingers back to the starting position, stretching every joint fully. Repeat this exercise 5-10 times with each hand. Tabletop 1. Stand or sit with your arm, hand, and all five fingers pointed straight up. Make sure to keep your wrist straight during the exercise. 2. Gently bend your fingers at the big knuckle, where your fingers meet your hand, as far down as you can while keeping the small knuckles in your fingers straight. Think of forming a tabletop with your fingers. 3. Hold this position for __________ seconds. 4. Extend your fingers back to the starting position, stretching every joint fully. Repeat this exercise 5-10 times with each hand. Finger spread 1. Place your hand flat on a table with your palm facing down. Make sure your wrist stays straight as you do this exercise. 2. Spread your fingers and thumb apart from each other as far as you can until you feel a gentle stretch. Hold this position for __________ seconds. 3. Bring your fingers and thumb tight together again. Hold this position for __________ seconds. Repeat this exercise 5-10 times with each hand. Making circles 1. Stand or sit with your arm, hand, and all  five fingers pointed straight up. Make sure to keep your wrist straight during the  exercise. 2. Make a circle by touching the tip of your thumb to the tip of your index finger. 3. Hold for __________ seconds. Then open your hand wide. 4. Repeat this motion with your thumb and each finger on your hand. Repeat this exercise 5-10 times with each hand. Thumb motion 1. Sit with your forearm resting on a table and your wrist straight. Your thumb should be facing up toward the ceiling. Keep your fingers relaxed as you move your thumb. 2. Lift your thumb up as high as you can toward the ceiling. Hold for __________ seconds. 3. Bend your thumb across your palm as far as you can, reaching the tip of your thumb for the small finger (pinkie) side of your palm. Hold for __________ seconds. Repeat this exercise 5-10 times with each hand. Grip strengthening  1. Hold a stress ball or other soft ball in the middle of your hand. 2. Slowly increase the pressure, squeezing the ball as much as you can without causing pain. Think of bringing the tips of your fingers into the middle of your palm. All of your finger joints should bend when doing this exercise. 3. Hold your squeeze for __________ seconds, then relax. Repeat this exercise 5-10 times with each hand. Contact a health care provider if:  Your hand pain or discomfort gets much worse when you do an exercise.  Your hand pain or discomfort does not improve within 2 hours after you exercise. If you have any of these problems, stop doing these exercises right away. Do not do them again unless your health care provider says that you can. Get help right away if:  You develop sudden, severe hand pain or swelling. If this happens, stop doing these exercises right away. Do not do them again unless your health care provider says that you can. This information is not intended to replace advice given to you by your health care provider. Make sure you discuss any  questions you have with your health care provider. Document Revised: 03/22/2019 Document Reviewed: 11/30/2018 Elsevier Patient Education  [place].

## 2020-04-22 LAB — SEDIMENTATION RATE: Sed Rate: 6 mm/h (ref 0–30)

## 2020-04-22 LAB — RHEUMATOID FACTOR: Rhuematoid fact SerPl-aCnc: <14 IU/mL (ref <14)

## 2020-04-22 LAB — URIC ACID: Uric Acid, Serum: 2.7 mg/dL (ref 2.5–7.0)

## 2020-04-22 LAB — CYCLIC CITRUL PEPTIDE ANTIBODY, IGG: Cyclic Citrullin Peptide Ab: <16 UNITS

## 2020-04-22 LAB — 14-3-3 ETA PROTEIN: 14-3-3 eta Protein: <0.2 ng/mL (ref <0.2)

## 2020-04-23 NOTE — Progress Notes (Signed)
I will discuss results at the follow-up visit.

## 2020-05-06 NOTE — Progress Notes (Signed)
Office Visit Note  Patient: Mia Jordan             Date of Birth: 28-May-1956           MRN: [id number]             PCP: Hayden Rasmussen, MD Referring: Hayden Rasmussen, MD Visit Date: 05/20/2020 Occupation: @GUAROCC @  Subjective:  Pain in multiple joints.   History of Present Illness: Mia Jordan is a 64 y.o. female with history of osteoarthritis and ulcerative colitis.  She states she continues to have pain and discomfort in her bilateral hands, bilateral feet and her knee joints.  She has been active playing tennis and practicing yoga.  She has not purchased the right [place] brace yet.  She states that her father has osteoarthritis in his knee joints.  Activities of Daily Living:  Patient reports morning stiffness for  5-10 minutes.   Patient Reports nocturnal pain.  Difficulty dressing/grooming: Denies Difficulty climbing stairs: Denies Difficulty getting out of chair: Denies Difficulty using hands for taps, buttons, cutlery, and/or writing: Reports  Review of Systems  Constitutional: Negative for fatigue, night sweats, weight gain and weight loss.  HENT: Negative for mouth sores, trouble swallowing, trouble swallowing, mouth dryness and nose dryness.   Eyes: Negative for pain, redness, itching, visual disturbance and dryness.  Respiratory: Negative for cough, shortness of breath and difficulty breathing.   Cardiovascular: Negative for chest pain, palpitations, hypertension, irregular heartbeat and swelling in legs/feet.  Gastrointestinal: Negative for blood in stool, constipation and diarrhea.  Endocrine: Negative for increased urination.  Genitourinary: Negative for difficulty urinating and vaginal dryness.  Musculoskeletal: Positive for arthralgias, joint pain, joint swelling and morning stiffness. Negative for myalgias, muscle weakness, muscle tenderness and myalgias.  Skin: Negative for color change, rash, hair loss, redness, skin tightness, ulcers and  sensitivity to sunlight.  Allergic/Immunologic: Negative for susceptible to infections.  Neurological: Negative for dizziness, numbness, headaches, memory loss, night sweats and weakness.  Hematological: Negative for bruising/bleeding tendency and swollen glands.  Psychiatric/Behavioral: Negative for depressed mood, confusion and sleep disturbance. The patient is not nervous/anxious.     PMFS History:  Patient Active Problem List   Diagnosis Date Noted  . Nephrolithiasis 04/20/2013  . Ulcerative colitis ([place]) 04/20/2013  . HEADACHE 08/22/2007    Past Medical History:  Diagnosis Date  . Kidney stones   . Ulcerative colitis ([place])   . Vitamin D deficiency     Family History  Problem Relation Age of Onset  . Alzheimer's disease Mother   . Anemia Mother   . Heart murmur Father   . Arrhythmia Father   . Healthy Son   . Healthy Daughter    History reviewed. No pertinent surgical history. Social History   Social History Narrative  . Not on file   Immunization History  Administered Date(s) Administered  . Hepatitis A 04/20/2013  . Hepatitis A, Adult 01/15/2016  . Hepatitis B 04/20/2013  . Hepatitis B, adult 08/31/2013, 01/15/2016  . Influenza,inj,Quad PF,6+ Mos 08/31/2013  . Tdap 04/20/2013     Objective: Vital Signs: BP 124/77 (BP Location: Left Arm, Patient Position: Sitting, Cuff Size: Normal)   Pulse 67   Resp 13   Ht 5' 7"  (1.702 m)   Wt 177 lb (80.3 kg)   LMP 11/27/2015   BMI 27.72 kg/m    Physical Exam Vitals and nursing note reviewed.  Constitutional:      Appearance: She is well-developed.  HENT:  Head: Normocephalic and atraumatic.  Eyes:     Conjunctiva/sclera: Conjunctivae normal.  Cardiovascular:     Rate and Rhythm: Normal rate and regular rhythm.     Heart sounds: Normal heart sounds.  Pulmonary:     Effort: Pulmonary effort is normal.     Breath sounds: Normal breath sounds.  Abdominal:     General: Bowel sounds are normal.      Palpations: Abdomen is soft.  Musculoskeletal:     Cervical back: Normal range of motion.  Lymphadenopathy:     Cervical: No cervical adenopathy.  Skin:    General: Skin is warm and dry.     Capillary Refill: Capillary refill takes less than 2 seconds.  Neurological:     Mental Status: She is alert and oriented to person, place, and time.  Psychiatric:        Behavior: Behavior normal.      Musculoskeletal Exam: C-spine, thoracic and lumbar spine with good range of motion.  Shoulder joints, elbow joints, wrist joints with good range of motion.  She has bilateral CMC DIP and PIP thickening with no synovitis.  Hip joints, knee joints with good range of motion.  She had no synovitis of her ankle joints.  She has DIP PIP and first MTP thickening bilaterally with no synovitis.  CDAI Exam: CDAI Score: -- Patient Global: --; Provider Global: -- Swollen: --; Tender: -- Joint Exam 05/20/2020   No joint exam has been documented for this visit   There is currently no information documented on the homunculus. Go to the Rheumatology activity and complete the homunculus joint exam.  Investigation: No additional findings.  Imaging: No results found.  Recent Labs: Lab Results  Component Value Date   WBC 7.1 04/07/2013   HGB 13.1 11/01/2013   NA 138 11/01/2013   K 4.4 11/01/2013   CL 106 11/01/2013   CO2 25 11/01/2013   GLUCOSE 111 (H) 11/01/2013   BUN 23 11/01/2013   CREATININE 0.70 11/01/2013   BILITOT 0.6 11/01/2013   ALKPHOS 69 12/25/2015   AST 20 12/25/2015   ALT 89 (H) 11/01/2013   PROT 6.7 11/01/2013   ALBUMIN 4.0 11/01/2013   CALCIUM 9.0 11/01/2013  Apr 16, 2020 ESR six, RF negative, anti-CCP negative, 14 three three eta negative, uric acid 2.7  Speciality Comments: No specialty comments available.  Procedures:  No procedures performed Allergies: Patient has no known allergies.   Assessment / Plan:     Visit Diagnoses: Primary osteoarthritis of both hands -  Bilateral CMC pain.  Right CMC brace prescription was given at the last visit.  She has not purchased it yet.  She is was advised to get it.  If she needs for her left hand she will call us.  All autoimmune work-up was negative.  The labs were discussed at length.  Joint protection muscle strengthening was emphasized.  Primary osteoarthritis of both feet-she has bilateral first MTP, PIP and DIP thickening.  No synovitis was noted.  Joint protection was discussed.  Proper fitting shoes were discussed.  Chronic pain of both shoulders - Patient plays tennis and practices yoga.  Contracture of left elbow - History of radial fracture in the past.  Ulcerative colitis without complications, unspecified location [facility]) - Followed by Dr. Collene Mares.  Nephrolithiasis  Vitamin D deficiency  Orders: No orders of the defined types were placed in this encounter.  No orders of the defined types were placed in this encounter.     Follow-Up Instructions:  Return if symptoms worsen or fail to improve, for Osteoarthritis.   Bo Merino, MD  Note - This record has been created using Editor, commissioning.  Chart creation errors have been sought, but may not always  have been located. Such creation errors do not reflect on  the standard of medical care.

## 2020-05-20 ENCOUNTER — Encounter: Payer: Self-pay | Admitting: Rheumatology

## 2020-05-20 ENCOUNTER — Ambulatory Visit: Payer: BC Managed Care – PPO | Admitting: Rheumatology

## 2020-05-20 ENCOUNTER — Other Ambulatory Visit: Payer: Self-pay

## 2020-05-20 VITALS — BP 124/77 | HR 67 | Resp 13 | Ht 67.0 in | Wt 177.0 lb

## 2020-05-20 DIAGNOSIS — M25511 Pain in right shoulder: Secondary | ICD-10-CM

## 2020-05-20 DIAGNOSIS — M19041 Primary osteoarthritis, right hand: Secondary | ICD-10-CM | POA: Diagnosis not present

## 2020-05-20 DIAGNOSIS — K5190 Ulcerative colitis, unspecified, without complications: Secondary | ICD-10-CM | POA: Diagnosis not present

## 2020-05-20 DIAGNOSIS — M19042 Primary osteoarthritis, left hand: Secondary | ICD-10-CM

## 2020-05-20 DIAGNOSIS — M25512 Pain in left shoulder: Secondary | ICD-10-CM

## 2020-05-20 DIAGNOSIS — G8929 Other chronic pain: Secondary | ICD-10-CM

## 2020-05-20 DIAGNOSIS — M19071 Primary osteoarthritis, right ankle and foot: Secondary | ICD-10-CM

## 2020-05-20 DIAGNOSIS — M24522 Contracture, left elbow: Secondary | ICD-10-CM | POA: Diagnosis not present

## 2020-05-20 DIAGNOSIS — M19072 Primary osteoarthritis, left ankle and foot: Secondary | ICD-10-CM

## 2020-05-20 DIAGNOSIS — E559 Vitamin D deficiency, unspecified: Secondary | ICD-10-CM

## 2020-05-20 DIAGNOSIS — N200 Calculus of kidney: Secondary | ICD-10-CM

## 2020-08-27 ENCOUNTER — Other Ambulatory Visit: Payer: Self-pay | Admitting: Family Medicine

## 2020-08-27 DIAGNOSIS — Z1231 Encounter for screening mammogram for malignant neoplasm of breast: Secondary | ICD-10-CM

## 2020-08-27 DIAGNOSIS — K51919 Ulcerative colitis, unspecified with unspecified complications: Secondary | ICD-10-CM

## 2021-01-15 ENCOUNTER — Ambulatory Visit: Payer: BC Managed Care – PPO

## 2021-03-06 ENCOUNTER — Ambulatory Visit: Payer: Self-pay

## 2021-03-23 ENCOUNTER — Other Ambulatory Visit: Payer: BC Managed Care – PPO

## 2021-04-27 ENCOUNTER — Other Ambulatory Visit: Payer: Self-pay

## 2021-04-27 ENCOUNTER — Ambulatory Visit
Admission: RE | Admit: 2021-04-27 | Discharge: 2021-04-27 | Disposition: A | Discharge status: Home / Self Care | Payer: BC Managed Care – PPO | Source: Ambulatory Visit | Attending: Family Medicine | Admitting: Family Medicine

## 2021-04-27 DIAGNOSIS — Z1231 Encounter for screening mammogram for malignant neoplasm of breast: Secondary | ICD-10-CM

## 2021-05-01 ENCOUNTER — Ambulatory Visit: Payer: BC Managed Care – PPO

## 2021-07-07 DIAGNOSIS — U071 COVID-19: Secondary | ICD-10-CM | POA: Diagnosis not present

## 2021-07-07 DIAGNOSIS — K5190 Ulcerative colitis, unspecified, without complications: Secondary | ICD-10-CM | POA: Diagnosis not present

## 2021-08-12 ENCOUNTER — Other Ambulatory Visit: Payer: Self-pay | Admitting: Family Medicine

## 2021-08-12 DIAGNOSIS — K51919 Ulcerative colitis, unspecified with unspecified complications: Secondary | ICD-10-CM

## 2021-08-14 ENCOUNTER — Other Ambulatory Visit: Payer: BC Managed Care – PPO

## 2021-09-08 DIAGNOSIS — M2550 Pain in unspecified joint: Secondary | ICD-10-CM | POA: Diagnosis not present

## 2021-09-08 DIAGNOSIS — K5120 Ulcerative (chronic) proctitis without complications: Secondary | ICD-10-CM | POA: Diagnosis not present

## 2021-09-08 DIAGNOSIS — K5190 Ulcerative colitis, unspecified, without complications: Secondary | ICD-10-CM | POA: Diagnosis not present

## 2021-10-23 DIAGNOSIS — Z23 Encounter for immunization: Secondary | ICD-10-CM | POA: Diagnosis not present

## 2021-12-22 ENCOUNTER — Other Ambulatory Visit: Payer: Self-pay | Admitting: Family Medicine

## 2021-12-22 DIAGNOSIS — Z9225 Personal history of immunosupression therapy: Secondary | ICD-10-CM

## 2021-12-22 DIAGNOSIS — K51919 Ulcerative colitis, unspecified with unspecified complications: Secondary | ICD-10-CM

## 2021-12-25 ENCOUNTER — Ambulatory Visit
Admission: RE | Admit: 2021-12-25 | Discharge: 2021-12-25 | Disposition: A | Discharge status: Home / Self Care | Payer: BC Managed Care – PPO | Source: Ambulatory Visit | Attending: Family Medicine | Admitting: Family Medicine

## 2021-12-25 DIAGNOSIS — K51919 Ulcerative colitis, unspecified with unspecified complications: Secondary | ICD-10-CM

## 2021-12-25 DIAGNOSIS — Z9225 Personal history of immunosupression therapy: Secondary | ICD-10-CM

## 2021-12-25 DIAGNOSIS — Z780 Asymptomatic menopausal state: Secondary | ICD-10-CM | POA: Diagnosis not present

## 2022-01-04 ENCOUNTER — Other Ambulatory Visit: Payer: Self-pay | Admitting: Family Medicine

## 2022-04-30 ENCOUNTER — Encounter (HOSPITAL_BASED_OUTPATIENT_CLINIC_OR_DEPARTMENT_OTHER): Payer: Self-pay

## 2022-06-23 ENCOUNTER — Ambulatory Visit (HOSPITAL_BASED_OUTPATIENT_CLINIC_OR_DEPARTMENT_OTHER): Payer: BC Managed Care – PPO | Admitting: Obstetrics & Gynecology

## 2022-06-30 ENCOUNTER — Ambulatory Visit (INDEPENDENT_AMBULATORY_CARE_PROVIDER_SITE_OTHER): Payer: BC Managed Care – PPO | Admitting: Obstetrics & Gynecology

## 2022-06-30 ENCOUNTER — Encounter (HOSPITAL_BASED_OUTPATIENT_CLINIC_OR_DEPARTMENT_OTHER): Payer: Self-pay | Admitting: Obstetrics & Gynecology

## 2022-06-30 ENCOUNTER — Other Ambulatory Visit (HOSPITAL_COMMUNITY)
Admission: RE | Admit: 2022-06-30 | Discharge: 2022-06-30 | Disposition: A | Discharge status: Home / Self Care | Payer: BC Managed Care – PPO | Source: Ambulatory Visit | Attending: Obstetrics & Gynecology | Admitting: Obstetrics & Gynecology

## 2022-06-30 VITALS — BP 134/65 | HR 86 | Ht 67.0 in | Wt 176.6 lb

## 2022-06-30 DIAGNOSIS — Z01419 Encounter for gynecological examination (general) (routine) without abnormal findings: Secondary | ICD-10-CM | POA: Diagnosis not present

## 2022-06-30 DIAGNOSIS — Z780 Asymptomatic menopausal state: Secondary | ICD-10-CM

## 2022-06-30 DIAGNOSIS — Z124 Encounter for screening for malignant neoplasm of cervix: Secondary | ICD-10-CM

## 2022-06-30 DIAGNOSIS — N9089 Other specified noninflammatory disorders of vulva and perineum: Secondary | ICD-10-CM

## 2022-06-30 DIAGNOSIS — Z1231 Encounter for screening mammogram for malignant neoplasm of breast: Secondary | ICD-10-CM

## 2022-06-30 NOTE — Progress Notes (Signed)
66 y.o. G3P2 Divorced White or Caucasian female here for annual exam.  Doing well.  Has hx of ulcerative colitis.  Followed by Dr. Collene Mares.  Last colonoscopy 2020.    Changed jobs in 2021.  Has a 32 week full job now.  Very happy with quality of life.    Patient's last menstrual period was 11/27/2015.          Sexually active: No.  The current method of family planning is post menopausal status.    Smoker:  no  Health Maintenance: Pap:  2019 History of abnormal Pap:  remote hx with normal follow up MMG:  04/27/2021 Colonoscopy:   02/23/2019 -- f/u 5 years, Dr. Collene Mares BMD:   12/2021 Screening Labs: does with PCP   reports that she has never smoked. She has never used smokeless tobacco. She reports current alcohol use of about 1.0 standard drink of alcohol per week. She reports that she does not use drugs.  Past Medical History:  Diagnosis Date   Kidney stones    Ulcerative colitis ([place])    Vitamin D deficiency     No past surgical history on file.  Current Outpatient Medications  Medication Sig Dispense Refill   cholecalciferol (VITAMIN D) 1000 units tablet Take 1,000 Units by mouth daily.     magnesium 30 MG tablet Take 30 mg by mouth daily.     mesalamine (LIALDA) 1.2 G EC tablet Take 1,200 mg by mouth daily with breakfast.     Omega-3 Fatty Acids (FISH OIL) 1000 MG CAPS      No current facility-administered medications for this visit.    Family History  Problem Relation Age of Onset   Alzheimer's disease Mother    Anemia Mother    Heart murmur Father    Arrhythmia Father    Healthy Son    Healthy Daughter    ROS: Genitourinary:negative  Exam:   BP 134/65 (BP Location: Left Arm, Patient Position: Sitting, Cuff Size: Large)   Pulse 86   Ht 5' 7"  (1.702 m) Comment: reported  Wt 176 lb 9.6 oz (80.1 kg)   LMP 11/27/2015   BMI 27.66 kg/m   Height: 5' 7"  (170.2 cm) (reported)  General appearance: alert, cooperative and appears stated age Head: Normocephalic, without  obvious abnormality, atraumatic Neck: no adenopathy, supple, symmetrical, trachea midline and thyroid normal to inspection and palpation Lungs: clear to auscultation bilaterally Breasts: normal appearance, no masses or tenderness Heart: regular rate and rhythm Abdomen: soft, non-tender; bowel sounds normal; no masses,  no organomegaly Extremities: extremities normal, atraumatic, no cyanosis or edema Skin: Skin color, texture, turgor normal. No rashes or lesions Lymph nodes: Cervical, supraclavicular, and axillary nodes normal. No abnormal inguinal nodes palpated Neurologic: Grossly normal   Pelvic: External genitalia: pigmented vulvar lesion 4 x 2.5m.  (Image below)              Urethra:  normal appearing urethra with no masses, tenderness or lesions              Bartholins and Skenes: normal                 Vagina: normal appearing vagina with normal color and no discharge, no lesions              Cervix: no lesions              Pap taken: Yes.   Bimanual Exam:  Uterus:  normal size, contour, position, consistency, mobility, non-tender  Adnexa: normal adnexa and no mass, fullness, tenderness               Rectovaginal: Confirms               Anus:  normal sphincter tone, no lesions     Chaperone, Octaviano Batty, CMA, was present for exam.  Assessment/Plan: 1. Well woman exam with routine gynecological exam -- Pap smear and HR HPV obtained otday - Mammogram 04/27/2021.  Order placed. - Colonoscopy 02/23/2019, follow up 5 years with Dr. Collene Mares.  Hx of UC. - Bone mineral density 12/2021 - lab work done done with PCP - vaccines reviewed/updated  2. Postmenopausal - no HRT.  She did have questions about benefits/risks and these were discussed today.  3. Encounter for screening mammogram for malignant neoplasm of breast - MM 3D SCREEN BREAST BILATERAL; Future  4. Cervical cancer screening - Cytology - PAP( [facility])  5. Vulvar lesion - image taken and lesion  measured.  Will monitor.  If grows, will remove.  Advised pt to monitor closely.  She feels it has been stable for years.

## 2022-07-02 LAB — CYTOLOGY - PAP
Comment: NEGATIVE
Diagnosis: NEGATIVE
High risk HPV: NEGATIVE

## 2022-07-03 DIAGNOSIS — N9089 Other specified noninflammatory disorders of vulva and perineum: Secondary | ICD-10-CM | POA: Insufficient documentation

## 2022-07-03 DIAGNOSIS — Z780 Asymptomatic menopausal state: Secondary | ICD-10-CM | POA: Insufficient documentation

## 2022-09-22 ENCOUNTER — Encounter (HOSPITAL_BASED_OUTPATIENT_CLINIC_OR_DEPARTMENT_OTHER): Payer: Self-pay | Admitting: Obstetrics & Gynecology

## 2022-10-07 DIAGNOSIS — Z86010 Personal history of colonic polyps: Secondary | ICD-10-CM | POA: Diagnosis not present

## 2022-10-07 DIAGNOSIS — K5190 Ulcerative colitis, unspecified, without complications: Secondary | ICD-10-CM | POA: Diagnosis not present

## 2022-10-07 DIAGNOSIS — K5120 Ulcerative (chronic) proctitis without complications: Secondary | ICD-10-CM | POA: Diagnosis not present

## 2022-12-30 ENCOUNTER — Encounter (HOSPITAL_BASED_OUTPATIENT_CLINIC_OR_DEPARTMENT_OTHER): Payer: Self-pay | Admitting: Obstetrics & Gynecology

## 2023-03-08 ENCOUNTER — Encounter (HOSPITAL_BASED_OUTPATIENT_CLINIC_OR_DEPARTMENT_OTHER): Payer: Self-pay | Admitting: Obstetrics & Gynecology

## 2023-03-17 ENCOUNTER — Encounter: Payer: Self-pay | Admitting: Family Medicine

## 2023-04-20 ENCOUNTER — Ambulatory Visit: Payer: BC Managed Care – PPO | Admitting: Family Medicine

## 2023-04-20 ENCOUNTER — Encounter: Payer: Self-pay | Admitting: Family Medicine

## 2023-04-20 VITALS — BP 120/70 | HR 72 | Temp 98.1°F | Resp 16 | Ht 67.0 in | Wt 169.4 lb

## 2023-04-20 DIAGNOSIS — K5100 Ulcerative (chronic) pancolitis without complications: Secondary | ICD-10-CM | POA: Diagnosis not present

## 2023-04-20 DIAGNOSIS — R052 Subacute cough: Secondary | ICD-10-CM

## 2023-04-20 DIAGNOSIS — Z23 Encounter for immunization: Secondary | ICD-10-CM

## 2023-04-20 DIAGNOSIS — Z0001 Encounter for general adult medical examination with abnormal findings: Secondary | ICD-10-CM

## 2023-04-20 DIAGNOSIS — Z0000 Encounter for general adult medical examination without abnormal findings: Secondary | ICD-10-CM

## 2023-04-20 MED ORDER — ESOMEPRAZOLE MAGNESIUM 40 MG PO CPDR: 40.0000 mg | DELAYED_RELEASE_CAPSULE | Freq: Two times a day (BID) | ORAL | 1 refills | Status: DC

## 2023-04-20 NOTE — Patient Instructions (Signed)
Welcome to [facility]! It was a pleasure meeting you today.  As discussed, Please schedule a 12 month follow up visit today.  Check on shingles if done in past.  Take the Nexium twice daily for 1 month.  If no change, let me know.   If better, take 1 more month  PLEASE NOTE:  If you had any LAB tests please let us know if you have not heard back within a few days. You may see your results on MyChart before we have a chance to review them but we will give you a call once they are reviewed by [place]. If we ordered any REFERRALS today, please let us know if you have not heard from their office within the next week.  Let us know through MyChart if you are needing REFILLS, or have your pharmacy send [place] the request. You can also use MyChart to communicate with me or any office staff.  Please try these tips to maintain a healthy lifestyle:  Eat most of your calories during the day when you are active. Eliminate processed foods including packaged sweets (pies, cakes, cookies), reduce intake of potatoes, white bread, white pasta, and white rice. Look for whole grain options, oat flour or almond flour.  Each meal should contain half fruits/vegetables, one quarter protein, and one quarter carbs (no bigger than a computer mouse).  Cut down on sweet beverages. This includes juice, soda, and sweet tea. Also watch fruit intake, though this is a healthier sweet option, it still contains natural sugar! Limit to 3 servings daily.  Drink at least 1 glass of water with each meal and aim for at least 8 glasses per day  Exercise at least 150 minutes every week.

## 2023-04-20 NOTE — Progress Notes (Signed)
New Patient Office Visit  Subjective:  Patient ID: Mia Jordan, female    DOB: 1956-08-30  Age: 67 y.o. MRN: [id number]  CC:  Chief Complaint  Patient presents with   Establish Care    Initial visit to establish care with new pcp Allergies over the last month, mostly dry ongoing cough Discuss shingles vaccine    HPI Mia Jordan presents for new patient.  Dr. Hal Hope moved  Allergies for past 1 month(s).  Dry cough for 2 months.  Had hoarseness.  For 1 week(s), had clear mucus, resolved but still coughing. Voice gravelly. No shortness of breath, no heartburn, sneezes a lot in am.  No congestion. No fevers/chills.  Antihist for 2 weeks in April-helped some.  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 Loreta Ave Mom had alzheimer's disease  Exercises.  Tennis/yoga. Hike/walk.    Current Outpatient Medications:    cholecalciferol (VITAMIN D) 1000 units tablet, Take 1,000 Units by mouth daily., Disp: , Rfl:    esomeprazole (NEXIUM) 40 MG capsule, Take 1 capsule (40 mg total) by mouth 2 (two) times daily before a meal., Disp: 60 capsule, Rfl: 1   magnesium 30 MG tablet, Take 30 mg by mouth daily., Disp: , Rfl:    mesalamine (LIALDA) 1.2 G EC tablet, Take 1,200 mg by mouth daily with breakfast., Disp: , Rfl:    TURMERIC PO, Take by mouth daily., Disp: , Rfl:   Past Medical History:  Diagnosis Date   Allergy April 2024   pollen   Arthritis    Cataract    Kidney stones    Ulcerative colitis (H[facility])    Vitamin D deficiency     History reviewed. No pertinent surgical history.  Family History  Problem Relation Age of Onset   Alzheimer's disease Mother    Anemia Mother    Heart murmur Father    Arrhythmia Father    Healthy Son    Healthy Daughter     Social History   Socioeconomic History   Marital status: Divorced    Spouse name: Not on file   Number of children: 2   Years of education: Not on file   Highest  education level: Not on file  Occupational History   Occupation: development  Tobacco Use   Smoking status: Never   Smokeless tobacco: Never  Vaping Use   Vaping Use: Never used  Substance and Sexual Activity   Alcohol use: Yes    Alcohol/week: 2.0 standard drinks of alcohol    Types: 1 Glasses of wine, 1 Standard drinks or equivalent per week    Comment: socially    Drug use: No   Sexual activity: Not Currently    Partners: Male    Birth control/protection: Post-menopausal  Other Topics Concern   Not on file  Social History Narrative   Not on file   Social Determinants of Health   Financial Resource Strain: Not on file  Food Insecurity: Not on file  Transportation Needs: Not on file  Physical Activity: Not on file  Stress: Not on file  Social Connections: Not on file  Intimate Partner Violence: Not on file    ROS  ROS: Gen: no fever, chills  Skin: no rash, itching ENT: no ear pain, ear drainage, nasal congestion, rhinorrhea, sinus pressure, sore throat Eyes: no blurry vision, double vision Resp: no cough, wheeze,SOB CV: no CP, palpitations, LE edema,  GI: no heartburn, n/v/d/c, abd pain GU: no dysuria,  urgency, frequency, hematuria MSK: wakes up w/sore neck and resolves. Neuro: no dizziness, headache, weakness, vertigo Psych: no depression, anxiety, insomnia, SI   Objective:   Today's Vitals: BP 120/70   Pulse 72   Temp 98.1 F (36.7 C) (Temporal)   Resp 16   Ht 5\' 7"  (1.702 m)   Wt 169 lb 6 oz (76.8 kg)   LMP 11/27/2015   SpO2 98%   BMI 26.53 kg/m   Physical Exam  Gen: WDWN NAD HEENT: NCAT, conjunctiva not injected, sclera nonicteric TM WNL B, OP moist, no exudates  voice a little gravely NECK:  supple, no thyromegaly, no nodes, no carotid bruits CARDIAC: RRR, S1S2+, no murmur. DP 2+B LUNGS: CTAB. No wheezes ABDOMEN:  BS+, soft, NTND, No HSM, no masses EXT:  no edema MSK: no gross abnormalities.  NEURO: A&O x3.  CN II-XII intact.  PSYCH:  normal mood. Good eye contact   Assessment & Plan:  Wellness examination  Need for tetanus booster -     Tdap vaccine greater than or equal to 7yo IM  Subacute cough  Ulcerative pancolitis without complication (HCC)  Other orders -     Esomeprazole Magnesium; Take 1 capsule (40 mg total) by mouth 2 (two) times daily before a meal.  Dispense: 60 capsule; Refill: 1  1  wellness-[facility] guidance.  Tetanus, Diphtheria, and Pertussis (Tdap) updated.  Reviewed Dr. Kenna Gilbert labs.  All ok x lipids not done.  She will check on shingrix-thinks got them.  Follow up 1 year(s) 2.  Cough-doubt infection.  ? Allergies, ?GERD, ?other.  Offered Prednisone. Declined.  Wants to wait on CHEST XRAY.  Will do Nexium 40 mg twice daily for 1 month.  If better, do 1 more month.  If not, let me know.    3.  Ulcerative colitis-chronic.  Controlled.  Care per gastroenterology   Follow-up: Return in about 1 year (around 04/19/2024) for annual physical.   Mia Sole, MD

## 2023-04-21 ENCOUNTER — Ambulatory Visit: Payer: BC Managed Care – PPO

## 2023-04-22 ENCOUNTER — Ambulatory Visit: Payer: BC Managed Care – PPO | Admitting: Family Medicine

## 2023-04-28 ENCOUNTER — Encounter: Payer: Self-pay | Admitting: Family Medicine

## 2023-04-28 DIAGNOSIS — E663 Overweight: Secondary | ICD-10-CM | POA: Diagnosis not present

## 2023-04-28 DIAGNOSIS — K5100 Ulcerative (chronic) pancolitis without complications: Secondary | ICD-10-CM | POA: Diagnosis not present

## 2023-04-28 DIAGNOSIS — M150 Primary generalized (osteo)arthritis: Secondary | ICD-10-CM | POA: Diagnosis not present

## 2023-04-28 DIAGNOSIS — K219 Gastro-esophageal reflux disease without esophagitis: Secondary | ICD-10-CM | POA: Diagnosis not present

## 2023-05-04 ENCOUNTER — Ambulatory Visit: Payer: BC Managed Care – PPO

## 2023-05-06 ENCOUNTER — Ambulatory Visit
Admission: RE | Admit: 2023-05-06 | Discharge: 2023-05-06 | Disposition: A | Discharge status: Home / Self Care | Payer: BC Managed Care – PPO | Source: Ambulatory Visit | Attending: Obstetrics & Gynecology | Admitting: Obstetrics & Gynecology

## 2023-05-06 DIAGNOSIS — Z1231 Encounter for screening mammogram for malignant neoplasm of breast: Secondary | ICD-10-CM | POA: Diagnosis not present

## 2023-05-12 ENCOUNTER — Other Ambulatory Visit: Payer: Self-pay | Admitting: Obstetrics & Gynecology

## 2023-05-12 ENCOUNTER — Other Ambulatory Visit (HOSPITAL_BASED_OUTPATIENT_CLINIC_OR_DEPARTMENT_OTHER): Payer: Self-pay | Admitting: *Deleted

## 2023-05-12 DIAGNOSIS — E663 Overweight: Secondary | ICD-10-CM | POA: Diagnosis not present

## 2023-05-12 DIAGNOSIS — K219 Gastro-esophageal reflux disease without esophagitis: Secondary | ICD-10-CM | POA: Diagnosis not present

## 2023-05-12 DIAGNOSIS — R928 Other abnormal and inconclusive findings on diagnostic imaging of breast: Secondary | ICD-10-CM

## 2023-05-12 DIAGNOSIS — K5100 Ulcerative (chronic) pancolitis without complications: Secondary | ICD-10-CM | POA: Diagnosis not present

## 2023-05-12 DIAGNOSIS — M150 Primary generalized (osteo)arthritis: Secondary | ICD-10-CM | POA: Diagnosis not present

## 2023-05-13 ENCOUNTER — Ambulatory Visit
Admission: RE | Admit: 2023-05-13 | Discharge: 2023-05-13 | Disposition: A | Discharge status: Home / Self Care | Payer: BC Managed Care – PPO | Source: Ambulatory Visit | Attending: Obstetrics & Gynecology | Admitting: Obstetrics & Gynecology

## 2023-05-13 DIAGNOSIS — R922 Inconclusive mammogram: Secondary | ICD-10-CM | POA: Diagnosis not present

## 2023-05-13 DIAGNOSIS — R921 Mammographic calcification found on diagnostic imaging of breast: Secondary | ICD-10-CM | POA: Diagnosis not present

## 2023-06-06 DIAGNOSIS — K5100 Ulcerative (chronic) pancolitis without complications: Secondary | ICD-10-CM | POA: Diagnosis not present

## 2023-06-06 DIAGNOSIS — M150 Primary generalized (osteo)arthritis: Secondary | ICD-10-CM | POA: Diagnosis not present

## 2023-06-06 DIAGNOSIS — K219 Gastro-esophageal reflux disease without esophagitis: Secondary | ICD-10-CM | POA: Diagnosis not present

## 2023-06-06 DIAGNOSIS — E663 Overweight: Secondary | ICD-10-CM | POA: Diagnosis not present

## 2023-06-23 DIAGNOSIS — K5100 Ulcerative (chronic) pancolitis without complications: Secondary | ICD-10-CM | POA: Diagnosis not present

## 2023-06-23 DIAGNOSIS — E663 Overweight: Secondary | ICD-10-CM | POA: Diagnosis not present

## 2023-06-23 DIAGNOSIS — K219 Gastro-esophageal reflux disease without esophagitis: Secondary | ICD-10-CM | POA: Diagnosis not present

## 2023-06-23 DIAGNOSIS — M150 Primary generalized (osteo)arthritis: Secondary | ICD-10-CM | POA: Diagnosis not present

## 2023-07-07 ENCOUNTER — Ambulatory Visit (INDEPENDENT_AMBULATORY_CARE_PROVIDER_SITE_OTHER): Payer: BC Managed Care – PPO | Admitting: Obstetrics & Gynecology

## 2023-07-07 ENCOUNTER — Encounter (HOSPITAL_BASED_OUTPATIENT_CLINIC_OR_DEPARTMENT_OTHER): Payer: Self-pay | Admitting: Obstetrics & Gynecology

## 2023-07-07 VITALS — BP 124/65 | HR 70 | Ht 67.0 in | Wt 168.2 lb

## 2023-07-07 DIAGNOSIS — Z01419 Encounter for gynecological examination (general) (routine) without abnormal findings: Secondary | ICD-10-CM

## 2023-07-07 DIAGNOSIS — N9089 Other specified noninflammatory disorders of vulva and perineum: Secondary | ICD-10-CM | POA: Diagnosis not present

## 2023-07-07 DIAGNOSIS — Z780 Asymptomatic menopausal state: Secondary | ICD-10-CM | POA: Diagnosis not present

## 2023-07-07 DIAGNOSIS — K5190 Ulcerative colitis, unspecified, without complications: Secondary | ICD-10-CM | POA: Diagnosis not present

## 2023-07-07 DIAGNOSIS — D171 Benign lipomatous neoplasm of skin and subcutaneous tissue of trunk: Secondary | ICD-10-CM

## 2023-07-07 NOTE — Progress Notes (Signed)
67 y.o. G3P2 Single White or Caucasian female here for annual exam.  Has established care with new PCP.  Denies vaginal bleeding.    Patient's last menstrual period was 11/27/2015.          Sexually active: No.  The current method of family planning is post menopausal status.    Exercising: Yes.     Smoker:  no  Health Maintenance: Pap:  06/30/2022 Negative History of abnormal Pap:  h/o abnormal pap with normal follow up MMG:  05/11/2023 Needed additional imaging Colonoscopy:  02/23/2019, Dr. Loreta Ave.  Follow up 5 year.   BMD:   12/25/2021. Normal/Recommended 5 year follow up Screening Labs: done with PCP   reports that she has never smoked. She has never used smokeless tobacco. She reports current alcohol use of about 2.0 standard drinks of alcohol per week. She reports that she does not use drugs.  Past Medical History:  Diagnosis Date   Allergy April 2024   pollen   Arthritis    Cataract    Kidney stones    Ulcerative colitis (HCC)    Vitamin D deficiency     No past surgical history on file.  Current Outpatient Medications  Medication Sig Dispense Refill   cholecalciferol (VITAMIN D) 1000 units tablet Take 1,000 Units by mouth daily.     magnesium 30 MG tablet Take 30 mg by mouth daily.     mesalamine (LIALDA) 1.2 G EC tablet Take 1,200 mg by mouth daily with breakfast.     TURMERIC PO Take by mouth daily.     No current facility-administered medications for this visit.    Family History  Problem Relation Age of Onset   Alzheimer's disease Mother    Anemia Mother    Heart murmur Father    Arrhythmia Father    Healthy Son    Healthy Daughter     ROS: Constitutional: negative Genitourinary:negative  Exam:   BP 124/65 (BP Location: Right Arm, Patient Position: Sitting, Cuff Size: Large)   Pulse 70   Ht 5\' 7"  (1.702 m) Comment: Reported  Wt 168 lb 3.2 oz (76.3 kg)   LMP 11/27/2015   BMI 26.34 kg/m   Height: 5\' 7"  (170.2 cm) (Reported)  General appearance:  alert, cooperative and appears stated age Head: Normocephalic, without obvious abnormality, atraumatic Neck: no adenopathy, supple, symmetrical, trachea midline and thyroid normal to inspection and palpation Lungs: clear to auscultation bilaterally Breasts: normal appearance, no masses or tenderness Heart: regular rate and rhythm Abdomen: soft, non-tender; bowel sounds normal; no masses,  no organomegaly Extremities: extremities normal, atraumatic, no cyanosis or edema Skin: Skin color, texture, turgor normal. No rashes or lesions except lipoma on right beneath breast Lymph nodes: Cervical, supraclavicular, and axillary nodes normal. No abnormal inguinal nodes palpated Neurologic: Grossly normal   Pelvic: External genitalia:  pigmented 4 x 3mm lesion on left mons, stable.  Image took last year and is in note              Urethra:  normal appearing urethra with no masses, tenderness or lesions              Bartholins and Skenes: normal                 Vagina: normal appearing vagina with normal color and no discharge, no lesions              Cervix: no lesions  Pap taken: No. Bimanual Exam:  Uterus:  normal size, contour, position, consistency, mobility, non-tender              Adnexa: normal adnexa and no mass, fullness, tenderness               Rectovaginal: Confirms               Anus:  normal sphincter tone, no lesions  Chaperone, Ina Homes, CMA, was present for exam.  Assessment/Plan: 1. Well woman exam with routine gynecological exam - Pap smear 2023.  Not indicated today. - Mammogram 05/11/2023 - Colonoscopy 2020, follow up 5 years - Bone mineral density 2023.  Normal.  Follow up 5 years.   - lab work done with PCP, Dr. Ruthine Dose - vaccines reviewed/updated  2. Postmenopausal - no HRT  3. Ulcerative colitis without complications, unspecified location [facility]) - followed by Dr. Loreta Ave  4. Vulvar lesion   5. Lipoma of torso - discussed referral to general  surgery, plastic surgery.  She will let me know if needs referral.

## 2023-07-13 DIAGNOSIS — H2513 Age-related nuclear cataract, bilateral: Secondary | ICD-10-CM | POA: Diagnosis not present

## 2024-01-11 ENCOUNTER — Encounter: Payer: Self-pay | Admitting: Family Medicine

## 2024-01-11 ENCOUNTER — Encounter (HOSPITAL_BASED_OUTPATIENT_CLINIC_OR_DEPARTMENT_OTHER): Payer: Self-pay | Admitting: Obstetrics & Gynecology

## 2024-04-03 ENCOUNTER — Other Ambulatory Visit: Payer: Self-pay | Admitting: Family Medicine

## 2024-04-03 DIAGNOSIS — Z780 Asymptomatic menopausal state: Secondary | ICD-10-CM

## 2024-04-20 ENCOUNTER — Encounter: Payer: BC Managed Care – PPO | Admitting: Family Medicine

## 2024-07-09 ENCOUNTER — Ambulatory Visit (HOSPITAL_BASED_OUTPATIENT_CLINIC_OR_DEPARTMENT_OTHER): Payer: BC Managed Care – PPO | Admitting: Obstetrics & Gynecology

## 2024-07-09 ENCOUNTER — Encounter (HOSPITAL_BASED_OUTPATIENT_CLINIC_OR_DEPARTMENT_OTHER): Payer: Self-pay | Admitting: Obstetrics & Gynecology

## 2024-07-09 VITALS — BP 123/72 | HR 75 | Wt 166.2 lb

## 2024-07-09 DIAGNOSIS — Z01419 Encounter for gynecological examination (general) (routine) without abnormal findings: Secondary | ICD-10-CM

## 2024-07-09 DIAGNOSIS — Z780 Asymptomatic menopausal state: Secondary | ICD-10-CM

## 2024-07-09 DIAGNOSIS — E2839 Other primary ovarian failure: Secondary | ICD-10-CM

## 2024-07-09 DIAGNOSIS — K5190 Ulcerative colitis, unspecified, without complications: Secondary | ICD-10-CM

## 2024-07-09 DIAGNOSIS — Z1231 Encounter for screening mammogram for malignant neoplasm of breast: Secondary | ICD-10-CM

## 2024-07-09 NOTE — Progress Notes (Signed)
 Breast and Pelvic exam Patient name: Mia Jordan MRN [id number]  Date of birth: [birth year] Chief Complaint:   Gynecologic Exam  History of Present Illness:   Mia Jordan is a 68 y.o. G3P2 Caucasian female being seen today for breast and pelvic exam.  Doing well.  Father passed last year.    Had colonoscopy with Dr. Kristie 06/08/2024  Follow up due 3 years.    Denies vaginal bleeding or vaginal discharge.    Patient's last menstrual period was 11/27/2015.   Last pap 06/30/2022. Results were: NILM w/ HRHPV negative. H/O abnormal pap: no Last mammogram: 05/13/2023. Results were: normal. Family h/o breast cancer: no Last colonoscopy: 06/08/2024 with Dr. Kristie.  [place].  Follow up 3 years.   DEXA:  will order     07/09/2024    3:10 PM 07/07/2023    8:31 AM 04/20/2023    3:27 PM 06/30/2022    3:51 PM 05/03/2016    4:00 PM  Depression screen PHQ 2/9  Decreased Interest 0 0 0 0 0  Down, Depressed, Hopeless 0 0 0 0 0  PHQ - 2 Score 0 0 0 0 0  Altered sleeping   0    Tired, decreased energy   0    Change in appetite   0    Feeling bad or failure about yourself    0    Trouble concentrating   0    Moving slowly or fidgety/restless   0    Suicidal thoughts   0    PHQ-9 Score   0    Difficult doing work/chores   Not difficult at all      Review of Systems:   Pertinent items are noted in HPI  Denies any  Pertinent History Reviewed:  Reviewed past medical,surgical, social and family history.  Reviewed problem list, medications and allergies. Physical Assessment:   Vitals:   07/09/24 1511  BP: 123/72  Pulse: 75  SpO2: 98%  Weight: 166 lb 3.2 oz (75.4 kg)  Body mass index is 26.03 kg/m.        Physical Examination:   General appearance - well appearing, and in no distress  Mental status - alert, oriented to person, place, and time  Psych:  She has a normal mood and affect  Skin - warm and dry, normal color, no suspicious lesions noted  Chest - effort normal, all lung  fields clear to auscultation bilaterally  Heart - normal rate and regular rhythm  Neck:  midline trachea, no thyromegaly or nodules  Breasts - breasts appear normal, no suspicious masses, no skin or nipple changes or  axillary nodes  Abdomen - soft, nontender, nondistended, no masses or organomegaly  Pelvic - VULVA: normal appearing vulva with no masses, tenderness or lesions   VAGINA: normal appearing vagina with normal color and discharge, pigmented lesion on lower left mons 3 x 4mm.  Compared to image taken in 2023.  appears the same.     CERVIX: normal appearing cervix without discharge or lesions, no CMT  Thin prep pap is   UTERUS: uterus is felt to be normal size, shape, consistency and nontender   ADNEXA: No adnexal masses or tenderness noted.  Rectal - deferred  Extremities:  No swelling or varicosities noted  Chaperone present for exam  No results found for this or any previous visit (from the past 24 hours).  Assessment & Plan:  1. Encntr for gyn exam (general) (routine) w/o abn findings (Primary) -  Pap smear neg 2023 - Mammogram ordered - Colonoscopy 05/2024.  Follow up 3 years. - Bone mineral density ordered. - lab work done with PCP, Dr. Annabelle Gentry - vaccines reviewed/updated  2. Breast cancer screening by mammogram - MM 3D SCREENING MAMMOGRAM BILATERAL BREAST; Future  3. Hypoestrogenism - DG Bone Density; Future  4. Postmenopausal - not on HRT  5. Ulcerative colitis without complications, unspecified location [facility]) - followed by Dr. Kristie   Orders Placed This Encounter  Procedures   DG Bone Density   MM 3D SCREENING MAMMOGRAM BILATERAL BREAST    Meds: No orders of the defined types were placed in this encounter.   Follow-up: Return in about 1 year (around 07/09/2025).  Ronal GORMAN Pinal, MD 07/09/2024 3:44 PM

## 2024-07-09 NOTE — Patient Instructions (Signed)
Call [phone] to schedule an appointment at [facility].    Call [phone] to scheduled at the [facility], [address] [place]  [place], [place] [address]  Mammograms can also be self-scheduled online through [place].

## 2024-12-14 ENCOUNTER — Ambulatory Visit

## 2024-12-24 ENCOUNTER — Inpatient Hospital Stay (HOSPITAL_BASED_OUTPATIENT_CLINIC_OR_DEPARTMENT_OTHER): Admission: RE | Admit: 2024-12-24 | Source: Ambulatory Visit
